# Patient Record
Sex: Female | Born: 1935
Health system: Southern US, Community
[De-identification: ages and names within clinical notes are randomized; demographics above are authoritative.]

## PROBLEM LIST (undated history)

## (undated) DIAGNOSIS — K5790 Diverticulosis of intestine, part unspecified, without perforation or abscess without bleeding: Secondary | ICD-10-CM

## (undated) DIAGNOSIS — I10 Essential (primary) hypertension: Secondary | ICD-10-CM

## (undated) DIAGNOSIS — T4145XA Adverse effect of unspecified anesthetic, initial encounter: Secondary | ICD-10-CM

## (undated) DIAGNOSIS — E7800 Pure hypercholesterolemia, unspecified: Secondary | ICD-10-CM

## (undated) DIAGNOSIS — M1990 Unspecified osteoarthritis, unspecified site: Secondary | ICD-10-CM

## (undated) DIAGNOSIS — K759 Inflammatory liver disease, unspecified: Secondary | ICD-10-CM

## (undated) DIAGNOSIS — I8390 Asymptomatic varicose veins of unspecified lower extremity: Secondary | ICD-10-CM

## (undated) DIAGNOSIS — T8859XA Other complications of anesthesia, initial encounter: Secondary | ICD-10-CM

## (undated) HISTORY — PX: TONSILLECTOMY: SUR1361

## (undated) HISTORY — PX: DILATION AND CURETTAGE OF UTERUS: SHX78

## (undated) HISTORY — DX: Diverticulosis of intestine, part unspecified, without perforation or abscess without bleeding: K57.90

## (undated) HISTORY — PX: OTHER SURGICAL HISTORY: SHX169

---

## 1977-12-27 DIAGNOSIS — K759 Inflammatory liver disease, unspecified: Secondary | ICD-10-CM

## 1977-12-27 HISTORY — DX: Inflammatory liver disease, unspecified: K75.9

## 1998-02-21 ENCOUNTER — Ambulatory Visit (HOSPITAL_COMMUNITY): Admission: RE | Admit: 1998-02-21 | Discharge: 1998-02-21 | Payer: Self-pay | Admitting: Obstetrics and Gynecology

## 1998-08-15 ENCOUNTER — Ambulatory Visit (HOSPITAL_COMMUNITY): Admission: RE | Admit: 1998-08-15 | Discharge: 1998-08-15 | Payer: Self-pay | Admitting: Internal Medicine

## 1999-02-23 ENCOUNTER — Encounter (HOSPITAL_BASED_OUTPATIENT_CLINIC_OR_DEPARTMENT_OTHER): Payer: Self-pay | Admitting: Internal Medicine

## 1999-02-23 ENCOUNTER — Ambulatory Visit (HOSPITAL_COMMUNITY): Admission: RE | Admit: 1999-02-23 | Discharge: 1999-02-23 | Payer: Self-pay | Admitting: Internal Medicine

## 1999-12-28 DIAGNOSIS — K5790 Diverticulosis of intestine, part unspecified, without perforation or abscess without bleeding: Secondary | ICD-10-CM

## 1999-12-28 HISTORY — DX: Diverticulosis of intestine, part unspecified, without perforation or abscess without bleeding: K57.90

## 2000-03-03 ENCOUNTER — Other Ambulatory Visit: Admission: RE | Admit: 2000-03-03 | Discharge: 2000-03-03 | Payer: Self-pay | Admitting: Obstetrics and Gynecology

## 2000-03-04 ENCOUNTER — Encounter: Payer: Self-pay | Admitting: Obstetrics and Gynecology

## 2000-03-04 ENCOUNTER — Ambulatory Visit (HOSPITAL_COMMUNITY): Admission: RE | Admit: 2000-03-04 | Discharge: 2000-03-04 | Payer: Self-pay | Admitting: Obstetrics and Gynecology

## 2000-12-27 HISTORY — PX: HYSTEROSCOPY: SHX211

## 2000-12-27 HISTORY — PX: OTHER SURGICAL HISTORY: SHX169

## 2001-01-23 ENCOUNTER — Ambulatory Visit (HOSPITAL_COMMUNITY): Admission: RE | Admit: 2001-01-23 | Discharge: 2001-01-23 | Payer: Self-pay | Admitting: Obstetrics and Gynecology

## 2001-01-23 ENCOUNTER — Encounter (INDEPENDENT_AMBULATORY_CARE_PROVIDER_SITE_OTHER): Payer: Self-pay

## 2001-03-06 ENCOUNTER — Encounter: Payer: Self-pay | Admitting: Obstetrics and Gynecology

## 2001-03-06 ENCOUNTER — Ambulatory Visit (HOSPITAL_COMMUNITY): Admission: RE | Admit: 2001-03-06 | Discharge: 2001-03-06 | Payer: Self-pay | Admitting: Obstetrics and Gynecology

## 2001-03-07 ENCOUNTER — Other Ambulatory Visit: Admission: RE | Admit: 2001-03-07 | Discharge: 2001-03-07 | Payer: Self-pay | Admitting: Obstetrics and Gynecology

## 2002-03-13 ENCOUNTER — Encounter: Payer: Self-pay | Admitting: Obstetrics and Gynecology

## 2002-03-13 ENCOUNTER — Ambulatory Visit (HOSPITAL_COMMUNITY): Admission: RE | Admit: 2002-03-13 | Discharge: 2002-03-13 | Payer: Self-pay | Admitting: Obstetrics and Gynecology

## 2002-03-20 ENCOUNTER — Other Ambulatory Visit: Admission: RE | Admit: 2002-03-20 | Discharge: 2002-03-20 | Payer: Self-pay | Admitting: Obstetrics and Gynecology

## 2002-08-26 ENCOUNTER — Encounter: Payer: Self-pay | Admitting: Orthopedic Surgery

## 2002-08-26 ENCOUNTER — Encounter: Admission: RE | Admit: 2002-08-26 | Discharge: 2002-08-26 | Payer: Self-pay | Admitting: Orthopedic Surgery

## 2003-03-18 ENCOUNTER — Ambulatory Visit (HOSPITAL_COMMUNITY): Admission: RE | Admit: 2003-03-18 | Discharge: 2003-03-18 | Payer: Self-pay | Admitting: Obstetrics and Gynecology

## 2003-03-18 ENCOUNTER — Encounter: Payer: Self-pay | Admitting: Obstetrics and Gynecology

## 2003-03-22 ENCOUNTER — Other Ambulatory Visit: Admission: RE | Admit: 2003-03-22 | Discharge: 2003-03-22 | Payer: Self-pay | Admitting: Obstetrics and Gynecology

## 2003-08-31 ENCOUNTER — Encounter: Admission: RE | Admit: 2003-08-31 | Discharge: 2003-08-31 | Payer: Self-pay | Admitting: Orthopedic Surgery

## 2003-08-31 ENCOUNTER — Encounter: Payer: Self-pay | Admitting: Orthopedic Surgery

## 2003-09-23 ENCOUNTER — Ambulatory Visit (HOSPITAL_BASED_OUTPATIENT_CLINIC_OR_DEPARTMENT_OTHER): Admission: RE | Admit: 2003-09-23 | Discharge: 2003-09-23 | Payer: Self-pay | Admitting: Orthopedic Surgery

## 2003-09-23 ENCOUNTER — Ambulatory Visit (HOSPITAL_COMMUNITY): Admission: RE | Admit: 2003-09-23 | Discharge: 2003-09-23 | Payer: Self-pay | Admitting: Orthopedic Surgery

## 2003-11-29 ENCOUNTER — Encounter: Admission: RE | Admit: 2003-11-29 | Discharge: 2003-11-29 | Payer: Self-pay | Admitting: Orthopedic Surgery

## 2003-12-28 HISTORY — PX: OTHER SURGICAL HISTORY: SHX169

## 2004-03-19 ENCOUNTER — Ambulatory Visit (HOSPITAL_COMMUNITY): Admission: RE | Admit: 2004-03-19 | Discharge: 2004-03-19 | Payer: Self-pay | Admitting: Obstetrics and Gynecology

## 2004-04-02 ENCOUNTER — Other Ambulatory Visit: Admission: RE | Admit: 2004-04-02 | Discharge: 2004-04-02 | Payer: Self-pay | Admitting: Obstetrics and Gynecology

## 2004-05-14 ENCOUNTER — Encounter (INDEPENDENT_AMBULATORY_CARE_PROVIDER_SITE_OTHER): Payer: Self-pay | Admitting: Specialist

## 2004-05-14 ENCOUNTER — Ambulatory Visit (HOSPITAL_COMMUNITY): Admission: RE | Admit: 2004-05-14 | Discharge: 2004-05-14 | Payer: Self-pay | Admitting: Gastroenterology

## 2005-03-22 ENCOUNTER — Ambulatory Visit (HOSPITAL_COMMUNITY): Admission: RE | Admit: 2005-03-22 | Discharge: 2005-03-22 | Payer: Self-pay | Admitting: Obstetrics and Gynecology

## 2005-04-14 ENCOUNTER — Other Ambulatory Visit: Admission: RE | Admit: 2005-04-14 | Discharge: 2005-04-14 | Payer: Self-pay | Admitting: Obstetrics and Gynecology

## 2005-05-02 ENCOUNTER — Emergency Department (HOSPITAL_COMMUNITY): Admission: EM | Admit: 2005-05-02 | Discharge: 2005-05-02 | Payer: Self-pay | Admitting: Emergency Medicine

## 2005-05-03 ENCOUNTER — Ambulatory Visit (HOSPITAL_COMMUNITY): Admission: RE | Admit: 2005-05-03 | Discharge: 2005-05-03 | Payer: Self-pay | Admitting: Internal Medicine

## 2006-03-24 ENCOUNTER — Ambulatory Visit (HOSPITAL_COMMUNITY): Admission: RE | Admit: 2006-03-24 | Discharge: 2006-03-24 | Payer: Self-pay | Admitting: Obstetrics and Gynecology

## 2006-05-03 ENCOUNTER — Other Ambulatory Visit: Admission: RE | Admit: 2006-05-03 | Discharge: 2006-05-03 | Payer: Self-pay | Admitting: Obstetrics and Gynecology

## 2006-12-27 HISTORY — PX: JOINT REPLACEMENT: SHX530

## 2006-12-27 HISTORY — PX: OTHER SURGICAL HISTORY: SHX169

## 2007-01-04 ENCOUNTER — Inpatient Hospital Stay (HOSPITAL_COMMUNITY): Admission: RE | Admit: 2007-01-04 | Discharge: 2007-01-08 | Payer: Self-pay | Admitting: Orthopedic Surgery

## 2007-03-27 ENCOUNTER — Ambulatory Visit (HOSPITAL_COMMUNITY): Admission: RE | Admit: 2007-03-27 | Discharge: 2007-03-27 | Payer: Self-pay | Admitting: Obstetrics and Gynecology

## 2007-06-02 ENCOUNTER — Other Ambulatory Visit: Admission: RE | Admit: 2007-06-02 | Discharge: 2007-06-02 | Payer: Self-pay | Admitting: Obstetrics and Gynecology

## 2008-04-01 ENCOUNTER — Ambulatory Visit (HOSPITAL_COMMUNITY): Admission: RE | Admit: 2008-04-01 | Discharge: 2008-04-01 | Payer: Self-pay | Admitting: Obstetrics and Gynecology

## 2008-06-03 ENCOUNTER — Other Ambulatory Visit: Admission: RE | Admit: 2008-06-03 | Discharge: 2008-06-03 | Payer: Self-pay | Admitting: Obstetrics and Gynecology

## 2008-09-25 ENCOUNTER — Ambulatory Visit (HOSPITAL_BASED_OUTPATIENT_CLINIC_OR_DEPARTMENT_OTHER): Admission: RE | Admit: 2008-09-25 | Discharge: 2008-09-25 | Payer: Self-pay | Admitting: Orthopedic Surgery

## 2009-04-03 ENCOUNTER — Ambulatory Visit (HOSPITAL_COMMUNITY): Admission: RE | Admit: 2009-04-03 | Discharge: 2009-04-03 | Payer: Self-pay | Admitting: Obstetrics and Gynecology

## 2010-04-06 ENCOUNTER — Ambulatory Visit (HOSPITAL_COMMUNITY): Admission: RE | Admit: 2010-04-06 | Discharge: 2010-04-06 | Payer: Self-pay | Admitting: Obstetrics and Gynecology

## 2010-06-03 ENCOUNTER — Emergency Department (HOSPITAL_COMMUNITY): Admission: EM | Admit: 2010-06-03 | Discharge: 2010-06-03 | Payer: Self-pay | Admitting: Family Medicine

## 2011-03-02 ENCOUNTER — Other Ambulatory Visit: Payer: Self-pay | Admitting: Obstetrics and Gynecology

## 2011-03-02 DIAGNOSIS — Z1231 Encounter for screening mammogram for malignant neoplasm of breast: Secondary | ICD-10-CM

## 2011-05-05 ENCOUNTER — Ambulatory Visit (HOSPITAL_COMMUNITY)
Admission: RE | Admit: 2011-05-05 | Discharge: 2011-05-05 | Disposition: A | Discharge status: Home / Self Care | Payer: Medicare Other | Source: Ambulatory Visit | Attending: Obstetrics and Gynecology | Admitting: Obstetrics and Gynecology

## 2011-05-05 DIAGNOSIS — Z1231 Encounter for screening mammogram for malignant neoplasm of breast: Secondary | ICD-10-CM | POA: Insufficient documentation

## 2011-05-11 NOTE — Op Note (Signed)
[name], [name]                 ACCOUNT NO.:  [id number]   MEDICAL RECORD NO.:  [id number]          PATIENT TYPE:  AMB   LOCATION:  NESC                         FACILITY:  [facility]   PHYSICIAN:  Ollen Gross, M.D.    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   PROCEDURE:  Right knee arthroscopy with synovectomy.   SURGEON:  Ollen Gross, M.D.   ASSISTANT:  No assistant.   ANESTHESIA:  General.   ESTIMATED BLOOD LOSS:  Minimal.   DRAINS:  None.   COMPLICATIONS:  None.   CONDITION:  Stable to recovery room.   BRIEF CLINICAL NOTE:  [name] is a 77 year old female who had a right  total knee arthroplasty done a couple years ago.  She has had pain with  stairs and has had a lot of popping and crepitance recently.  Exam and  history suggested synovitis with patellar clunk syndrome.  She presents  now for arthroscopy and synovectomy.   PROCEDURE IN DETAIL:  After the successful administration of general  anesthetic, tourniquet was placed high on the right thigh, right lower  extremity prepped and draped in the usual sterile fashion.  Standard  superomedial and inferolateral incisions made.  Inflow cannula passed  superomedial, camera passed inferolateral.  Arthroscopic visualization  proceeds.  At the junction of the quad tendon and patella there was a  large amount of hypertrophic synovial tissue.  A superolateral portal  was created and ArthroCare and shaver passed through there.  The  hypertrophic synovium was debrided back to the undersurface of the  tendon.  This effectively cleared out the suprapatellar pouch.  There  was also some synovitis in the lateral gutter and we debrided that out.  We then created an inferomedial  portal to get some of the inferior scar  out.  Once all the scar removal was completed I again inspected the  joint.  There were no areas of synovitis or loose bodies.  The  arthroscopic equipment was removed  from the inferior portals which were closed with interrupted 4-0 nylon.  Twenty mL of 0.25% Marcaine with epi injected through the inflow cannula  and then that is removed and that portal closed with nylon.  Bulky  sterile dressing was applied and she was awakened and transported to  recovery in stable condition.      Ollen Gross, M.D.  Electronically Signed     FA/MEDQ  D:  09/25/2008  T:  09/25/2008  Job:  [id number]

## 2011-05-14 NOTE — Op Note (Signed)
NAMELARENA, OHNEMUS                 ACCOUNT NO.:  [id number]   MEDICAL RECORD NO.:  [id number]          PATIENT TYPE:  INP   LOCATION:  X006                         FACILITY:  [facility]   PHYSICIAN:  Ollen Gross, M.D.    DATE OF BIRTH:  [birth year]   DATE OF PROCEDURE:  01/04/2007  DATE OF DISCHARGE:                               OPERATIVE REPORT   PREOPERATIVE DIAGNOSIS:  Osteoarthritis right knee.   POSTOPERATIVE DIAGNOSIS:  Osteoarthritis right knee.   PROCEDURE:  Right total knee arthroplasty.   SURGEON:  Dr. Lequita Halt.   ASSISTANT:  Avel Peace.   ANESTHESIA:  General with postop Marcaine pain pump.   ESTIMATED BLOOD LOSS:  Minimal.   DRAINS:  Hemovac x1.   TOURNIQUET TIME:  39 minutes at 300 mmHg.   COMPLICATIONS:  None.   CONDITION:  Stable to recovery.[name]   BRIEF CLINICAL NOTE:  Ms. Masaki is a 77 year old female with end-stage  arthritis in the  lateral and patellofemoral compartments of the right  knee.  She has failed nonoperative management and presents for total  knee arthroplasty.   DESCRIPTION OF PROCEDURE:  After successful administration of general  anesthetic, a tourniquet was placed high on the right thigh and right  lower extremity prepped and draped in the usual sterile fashion.  The  extremity is wrapped in Esmarch, knee flexed, tourniquet inflated to 300  mmHg. A midline incision is made with a 10 blade through the  subcutaneous tissue to the level of the extensor mechanism.  She did not  have a significant valgus deformity, so a fresh blade was then used to  make a medial parapatellar arthrotomy.  We minimally elevated the soft  tissue over the proximal medial tibia and then elevated the soft tissue  over the proximal lateral tibia with attention being paid to avoiding  the patellar tendon on the tibial tubercle. The patella is subluxed  laterally, knee flexed 90 degrees, ACL and PCL removed.  A drill was  used create a starting hole in the distal  femur and canal was thoroughly  irrigated.  A 5 degree right valgus and alignment guide is placed and  referencing off the posterior condyles, rotation is marked and a block  pinned to remove approximately 11 mm off the distal femur.  We took 11  because of a slight flexion contracture.  Distal femoral resection is  made with an oscillating saw.  A size 3 is the most appropriate femoral  component.  Rotation is marked off the epicondylar axis and a size 3  cutting block placed.  The anterior, posterior and chamfer cuts were  made.   The tibia subluxed forward and the menisci are removed.  Extramedullary  tibial alignment guide is placed referencing proximally at the medial  aspect of the tibial tubercle and distally along the second metatarsal  axis and tibial crest.  The block is pinned to remove approximately 6 mm  off the more deficient lateral side.  Tibial resection is made with an  oscillating saw.  A spacer block is placed 10 mm, 90 degrees of flexion  and in full extension.  We had excellent balance in both the flexion and  extension gap.  The size 2.5 is the most appropriate tibial component  and the proximal tibia is prepared with the modular drill and keel punch  for a size 2.5.  Femoral preparation is completed with the intercondylar  cut for the size 3.   A size 2.5 mobile bearing tibial trial, size 3 posterior stabilized  femoral trial and a 10 mm posterior stabilized rotating platform insert  trial are placed.  With the 10, full extension was achieved with  excellent varus and valgus balance throughout full range of motion.  The  osteophytes are then removed off the posterior femur.  The patella was  everted and thickness measured to be 20 mm.  Freehand resection is taken  to 12 mm, 35 template is placed, lug holes are drilled, trial patella  was placed and it tracks normally.  All trials were then removed and the  cut bone surfaces are prepared with pulsatile lavage.   The cement was  mixed and once ready for implantation the size 2.5 mobile bearing tibial  tray, size 3 posterior stabilized femur, and 35 patella are cemented  into place and the patella was held with a clamp.  A trial 10-mm insert  was placed, knee held in full extension and all extruded cement removed.  Once the cement was fully hardened then the permanent 10 mm posterior  stabilized rotating platform insert is placed into the tibial tray. The  wound was copiously irrigated with saline solution and the extensor  mechanism closed over a Hemovac drain with interrupted #1 PDS.  Flexion  against gravity is 135 degrees.  The tourniquet was released with a  total tourniquet time of 39 minutes.  Minor bleeding stopped with  cautery.  The subcu was closed with interrupted 2-0 Vicryl, subcuticular  with running 4-0 Monocryl.  The drain is hooked to suction, the incision  cleaned and dried and the catheter for the Marcaine pain pump is placed  in the pump is initiated.  A bulky sterile dressing is applied.  She is  placed into a knee immobilizer, awakened and transported to recovery in  stable condition.      Ollen Gross, M.D.  Electronically Signed     FA/MEDQ  D:  01/04/2007  T:  01/04/2007  Job:  [id number]

## 2011-05-14 NOTE — Op Note (Signed)
[facility]  Patient:    [name], [name]                        MRN: [id number] Proc. Date: 01/23/01 Adm. Date:  [id number] Attending:  Brynda Peon                           Operative Report  PREOPERATIVE DIAGNOSES:       1. Postmenopausal bleeding.                               2. Known endometrial polyps.  POSTOPERATIVE DIAGNOSES:      1. Postmenopausal bleeding.                               2. Known endometrial polyps.  OPERATION:                    Hysteroscopy, D&C, removal of polyp.  SURGEON:                      Cynthia P. Ashley Royalty, M.D.  ASSISTANT:  ANESTHESIA:                   Modified anesthesia care plus paracervical                               block.  ESTIMATED BLOOD LOSS:         Minimal.  COMPLICATIONS:                None.  FLUID DEFICIT:                110 cc but there were four soaked 4 x 4                               sponges that had been used to evacuate the                               Sorbitol from the vagina so the deficit was                               less than 110 cc in reality.  DESCRIPTION OF PROCEDURE:     The patient was taken to the operating room and given sedation through anesthesia.  Cervix was visualized and a paracervical block was instituted by injected 10 cc of 1% plain Xylocaine in each at 3 oclock and 9 oclock.  The uterus was then sounded to 7.5 cm.  The cervix was dilated to a #31 Shawnie Pons with some difficulty.  The resectoscope was introduced but would not pass easily through the endocervical canal.  It was very tight. Therefore, the resectoscope was removed and the diagnostic scope was inserted which passed easily.  Sorbitol was used as a distending medium.  The endometrial cavity was visualized.  The right and left tubal ostia were normal.  There was a polyp coming off the anterior left side of the fundus and this was photo documented.  The hysteroscope was removed. The polyp  forceps were introduced and the polyp was removed and sent to pathology.  Sharp curettage was carried out.  The specimen was sent to pathology.  The diagnostic scope was reinserted. The endometrial cavity appeared clean.  The hysteroscope was removed and the procedure was terminated.  The speculum was removed.  The tenaculum was removed and the patient was taken to the recovery room in good condition.  Sponge, needle and instrument counts were correct. DD:  01/23/01 TD:  01/23/01 Job: [id number]

## 2011-05-14 NOTE — H&P (Signed)
[name], WOMBLES NO.:  [id number]   MEDICAL RECORD NO.:  [id number]         PATIENT TYPE:  LINP   LOCATION:                               [facility]ACILITY[facility]  [facility]   PHYSICIAN:  Ollen Gross, M.D.    DATE OF BIRTH:  20-Jul-1932   DATE OF ADMISSION:  01/04/2007  DATE OF DISCHARGE:                              HISTORY & PHYSICAL   Date of office visit and history and physical was December 22, 2006.  Date of admission is January 04, 2007.   CHIEF COMPLAINT:  Bilateral knee pain.   HISTORY OF PRESENT ILLNESS:  The patient is a 77 year old female who has  been seen by Dr. Lequita Halt for ongoing bilateral knee pain.  Initially the  left knee was more problematic than the right; however, recently the  right knee has been causing her much more problems.  She is known to  have significant arthritis and pain in both knees.  Over the past  several months the right knee has progressively gotten worse and  interfering with her mobility and activity, starting to limit what she  can and cannot do.  She has been treated conservatively in the past for  her arthritis, including injections.  Despite conservative measures and  injections, she continues to have pain.  It is felt that she had reached  the point where she would benefit from undergoing surgical intervention.  Risks and benefits have been discussed and she has elected to proceed  with surgery.  She has been seen preoperatively by Dr. Creola Corn with  workup and is approved and cleared for her knee replacement at this  time.   ALLERGIES:  No known drug allergies.   CURRENT MEDICATIONS:  Crestor, amlodipine/benazepril, indapamide, baby  aspirin, biotin, Caltrate Plus D, Centrum Silver,  glucosamine/chondroitin, and Tylenol Arthritis.   PAST MEDICAL HISTORY:  1. Hypertension.  2. Hyperlipidemia.  3. Osteopenia.  4. Hemorrhoids.  5. History of hepatitis A in 1979.  6. Insomnia.  7. Postmenopausal.   PAST SURGICAL  HISTORY:  1. Tonsillectomy and adenoidectomy.  2. D&C x2.  3. Right knee arthroscopy.  4. Right shoulder rotator cuff repair.  5. Basal cell carcinoma removal.   SOCIAL HISTORY:  Married.  Three children, 5 grandchildren.  She is a  housewife.  Nonsmoker.  Seldom intake of alcohol.   FAMILY HISTORY:  Mother and father both had strokes.  Mother with a  history of osteopenia.  Mother deceased at age 72.  Father:  History of  prostate cancer, deceased at age 32.  Sister with bilateral knee  replacements and a hip replacement.  One of her children has multiple  sclerosis.   REVIEW OF SYSTEMS:  GENERAL:  No fevers, chills or night sweats.  NEUROLOGIC:  No seizure, syncope or paralysis.  RESPIRATORY:  No  shortness of breath, productive cough or hemoptysis.  CARDIOVASCULAR:  No chest pain, angina or orthopnea.  GASTROINTESTINAL:  No nausea,  vomiting, diarrhea or constipation.  GENITOURINARY:  No dysuria,  hematuria or discharge.  MUSCULOSKELETAL:  Right knee.   PHYSICAL EXAMINATION:  VITAL  SIGNS:  Pulse 68, respirations 12, blood  pressure 128/76.  GENERAL:  The patient is a 77 year old white female, well-nourished,  well-developed, in no acute distress.  She is a good historian.  She is  alert, oriented and cooperative, very pleasant at time of exam.  HEENT:  Normocephalic, atraumatic.  Pupils round, reactive.  EOMs  intact.  Oropharynx is clear.  NECK:  Supple, no carotid bruits are appreciated.  CHEST:  Clear anterior/posterior chest walls.  No rhonchi, rales or  wheezing.  HEART:  Regular rhythm, S1, S2 noted, no murmurs.  ABDOMEN:  Soft, nontender.  Bowel sounds present.  RECTAL, BREASTS, GENITALIA:  Not done, not pertinent to present illness.  EXTREMITIES:  Right knee shows a slight valgus malalignment deformity,  range of motion of 0-125, 10 more medial than lateral.  Left knee shows  a range of motion of 0-125, no swelling, no instability.   IMPRESSION:  1. Bilateral knee  osteoarthritis, right more symptomatic than left.  2. Hypertension.  3. Hyperlipidemia.  4. Osteopenia.  5. Hemorrhoids.  6. History of hepatitis A, 1979.  7. Insomnia.  8. Postmenopausal.   PLAN:  The patient admitted to [facility] to undergo a right  total knee replacement arthroplasty.  Surgery will be performed by Ollen Gross, MD.  Her medical physician is Gwen Pounds, MD, who will be  notified of her room number and admission and will be consulted if  needed for medical assistance with the patient throughout the hospital  course.      Alexzandrew L. Julien Girt, P.A.      Ollen Gross, M.D.  Electronically Signed    ALP/MEDQ  D:  01/03/2007  T:  01/04/2007  Job:  [id number]   cc:   Gwen Pounds, MD  Fax: [phone]

## 2011-05-14 NOTE — Discharge Summary (Signed)
[name], [name]                 ACCOUNT NO.:  [id number]   MEDICAL RECORD NO.:  [id number]          PATIENT TYPE:  INP   LOCATION:  1503                         FACILITY:  [facility]   PHYSICIAN:  Ollen Gross, M.D.    DATE OF BIRTH:  November 24, 1932   DATE OF ADMISSION:  01/04/2007  DATE OF DISCHARGE:  01/08/2007                               DISCHARGE SUMMARY   ADMITTING DIAGNOSES:  1. Bilateral knees osteoarthritis, right more symptomatic than left.  2. Hypertension.  3. Hyperlipidemia.  4. Osteopenia.  5. Hemorrhoids.  6. History of hepatitis A in 1979.  7. Insomnia.  8. Postmenopausal.   DISCHARGE DIAGNOSES:  1. Osteoarthritis, right knee, status post right total knee      arthroplasty.  2. Osteoarthritis of left knee.  3. Mild acute blood loss anemia.  4. Postoperative hypokalemia, improved.  5. Hypertension.  6. Hyperlipidemia.  7. Osteopenia.  8. Hemorrhoids.  9. History of hepatitis A in 1979.  10.Insomnia.  11.Postmenopausal.   PROCEDURE:  January 03, 2007, right total knee, surgeon Dr. Lequita Halt,  assistant Avel Peace, PA-C.  Anesthesia general.  Tourniquet time 39  minutes.   CONSULTS:  None.   BRIEF HISTORY:  [name] is a 77 year old female with end-stage  arthritis in the lateral patellofemoral compartment, has failed  nonoperative management, now presents for total knee arthroplasty.   LABORATORY DATA:  Preop CBC:  Hemoglobin 11.6, hematocrit 33.2, white  cell count 7.2, postop hemoglobin dropped down to 10.3, then to 9.4,  back up to 9.8.  Preop PTT/PT 13.1 and 32, respectively.  INR 1.0.  Serial pro times followed.  Last noted PT/INR 18.7 and 1.5.  Chem panel  on admission all within normal limits.  Serial BMETs were followed.  Sodium did drop down from 4.3 to 3.3, back up to 3.8.   EKG:  EKG is dated December 30, 20078, normal sinus rhythm, no change  from previous tracing confirmed.  Chest x-ray January 06, 2007, tiny  bilateral pleural effusions,  mildly prominent right hilum, clinical  suspicion, consider CT.   HOSPITAL COURSE:  The patient admitted to [facility], taken to  the OR, underwent above-stated procedure without complication.  The  patient tolerated the procedure well, later transferred to recovery room  and orthopedic floor, started on PCA and p.o. analgesics for pain  control following surgery, given 24 hours postop IV antibiotic, ended up  staying in the PACU through the night actually and then transferred up  to the orthopedic floor on the morning of day 1.  Did very well that  evening.  Hemovac drain was pulled on day 1.  Had excellent urinary  output.  Once got up to the floor, started getting up physical therapy.  By day 2, she was getting up a little bit.  She did have some nausea in  the afternoon of day 1.  Nausea was improved.  Dressing was changed;  incision looked good.  Fluids and IVs were discontinued at that point.  Hemoglobin was down to 10.3.  It was felt that if she was doing well,  she may be able to go home over the weekend.  On day 3, she was doing a  little bit better.  Continued to improve, started getting up with more  physical therapy.  She was up ambulating about 40 feet but not quite  ready for discharge.  Weaned over to p.o. medications, progressed with  therapy and by the following day of January 08, 2007, she was up moving  well, tolerating medications, was discharged home.   DISCHARGE PLAN:  1. The patient discharged home on January 08, 2007.  2. Discharge diagnoses, please see above.  3. Discharge medications:  Percocet, Robaxin, Coumadin.  4. Diet:  Resume home diet.  5. Activity:  Weightbearing as tolerated, total knee protocol, home      health physical therapy, home health nursing, followup in 2 weeks.   DISPOSITION:  Home.   CONDITION ON DISCHARGE:  Improved.      Alexzandrew L. Julien Girt, P.A.      Ollen Gross, M.D.  Electronically Signed    ALP/MEDQ  D:   02/03/2007  T:  02/03/2007  Job:  [id number]   cc:   Gwen Pounds, MD  Fax: [phone]

## 2011-05-14 NOTE — Op Note (Signed)
[name], [name]                           ACCOUNT NO.:  [id number]   MEDICAL RECORD NO.:  [id number]                   PATIENT TYPE:  AMB   LOCATION:  ENDO                                 FACILITY:  [facility]   PHYSICIAN:  Bernette Redbird, M.D.                DATE OF BIRTH:  1932-07-06   DATE OF PROCEDURE:  05/14/2004  DATE OF DISCHARGE:                                 OPERATIVE REPORT   PROCEDURE PERFORMED:  Colonoscopy with biopsy.   ENDOSCOPIST:  Florencia Reasons, M.D.   INDICATIONS FOR PROCEDURE:  The patient is a 77 year old female for colon  cancer screening.  No worrisome risk factors or symptoms.   FINDINGS:  Diminutive polyp removed.  Moderate diverticulosis.   DESCRIPTION OF PROCEDURE:  The nature, purpose and risks of the procedure  had been discussed with the patient, who provided written consent.  Sedation  was fentanyl 100 mcg and Versed 10 mg IV without arrhythmias or  desaturation.   The procedure was initiated with the Olympus adult video colonoscope which  was advanced to about 30 cm but there I encountered a fairly sharp kink in  the colon, past which I was unable to advance the scope, so the scope was  withdrawn, examining the rectosigmoid.  Retroflexion in the rectum was  unremarkable as was reinspection of the rectum.  I then recolonoscoped the  patient using the adjustable tension pediatric video colonoscope which was  able to be advanced quite easily to the cecum as identified by clear  visualization of the appendiceal orifice and pullback was then performed.  The quality of the prep was excellent and it is felt that all areas were  well seen.   There was moderate sigmoid diverticulosis with fixation and angulation, also  some mild proximal colonic diverticular change as well.  At 28 cm from the  external anal opening was a hyperplastic appearing 3 mm polyp removed by a  couple of cold biopsies.  No other polyps were seen and there was no  evidence of  cancer, colitis or vascular malformations.   The patient tolerated the procedure well and there were no apparent  complications.   IMPRESSION:  1. Solitary diminutive colonic polyp removed as described above (211.3).  2. Moderate diverticulosis.   PLAN:  Await pathology results.  I would anticipate a screening flexible  sigmoidoscopy in five years if the polyp is hyperplastic and a full  colonoscopy in five years if the polyp is adenomatous.                                               Bernette Redbird, M.D.    RB/MEDQ  D:  05/14/2004  T:  05/15/2004  Job:  [id number]   cc:   Aram Beecham P. Romine,  M.D.  [address], [address]  [place]  [place] [address]  Fax: [phone]   Gwen Pounds, M.D.  [address]  [place]  [place] [address]  Fax: [phone]

## 2011-05-14 NOTE — Op Note (Signed)
[name], [name]                           ACCOUNT NO.:  [id number]   MEDICAL RECORD NO.:  [id number]                   PATIENT TYPE:  AMB   LOCATION:  DSC                                  FACILITY:  [facility]   PHYSICIAN:  Robert A. Thurston Hole, M.D.              DATE OF BIRTH:  May 13, 1932   DATE OF PROCEDURE:  09/23/2003  DATE OF DISCHARGE:                                 OPERATIVE REPORT   PREOPERATIVE DIAGNOSIS:  1. Right shoulder rotator cuff tear.  2. Right shoulder biceps tendon dislocation.  3. Right shoulder partial labrum tear.  4. Right shoulder impingement.   POSTOPERATIVE DIAGNOSIS:  1. Right shoulder rotator cuff tear.  2. Right shoulder biceps tendon dislocation.  3. Right shoulder partial labrum tear.  4. Right shoulder impingement.   PROCEDURE:  1. Right shoulder EUA followed by arthroscopic partial labrum tear     debridement.  2. Right shoulder biceps tendon release and tenodesis.  3. Right shoulder mini open rotator cuff repair using Arthr ek suture     anchors x2.  4. Right shoulder subacromial decompression.   SURGEON:  Elana Alm. Thurston Hole, M.D.   ASSISTANT:  Julien Girt, P.A.   ANESTHESIA:  General.   OPERATIVE TIME:  1 hour 10 minutes.   COMPLICATIONS:  None.   INDICATIONS FOR PROCEDURE:  The patient is a 77 year old woman who has had  significant right shoulder pain for the past six to eight weeks secondary to  a fall and injury to her shoulder with examination and MRI documenting  rotator cuff tear and biceps tendon dislocation. She is now to undergo  arthroscopy and repair.   DESCRIPTION OF PROCEDURE:  The patient was brought to the operating room on  September 23, 2003, after an interscalene block had been placed in the  holding room for postoperative pain control. She was placed on the operating  table in the supine position.  After being placed under general anesthesia,  her right shoulder was examined under anesthesia. She had full  range of  motion and her shoulder was stable to ligamentous examination.  After this  was done, she was placed in a beach chair position and her shoulder and arm  was prepped using sterile Duraprep and draped using sterile technique. She  received Ancef 1 gram IV for prophylaxis.  Initially the arthroscopy was  performed through a posterior arthroscopic portal, and the arthroscope with  a pump attached was placed and through an anterior portal, an arthroscopic  probe was placed.  On initial inspection, the articular cartilage and the  glenohumeral joint was intact.  The anterior labrum showed partial tearing  25% which was debrided.  The inferior labrum and anterior inferior  glenohumeral ligament complex was intact.  Superior labrum was intact.  The  biceps tendon was completely dislocated anterior inferiorly.  The posterior  labrum was intact.  Inferior capsule recess was free  of pathology. The  rotator cuff showed a large tear with retraction of the supraspinatus and  infraspinatus and this was partially debrided arthroscopically.  Significant  impingement noted as well and a subacromial decompression was carried out  removing 6 mm of the undersurface of the anterior, anterolateral, and  anterior medial acromion.  After this was done then through the lateral  portal, this was extended to a 3 to 4 cm deltoid splitting incision. The  underlying subcutaneous tissues were incised in line with the skin incision.  Subacromial space was entered by bluntly dissecting through the deltoid  muscle and fascia.  The biceps tendon was found and released from its  insertion on the glenoid rim and then shortened and a #2 Fiberwire was  weaved through it and then each of these ends of the sutures were placed  through each medial and lateral bony edge of the bicipital groove and then  tied down, thus resecuring the biceps tendon and tenodesing it into the  humeral groove.  After this was done, then  attention was turned to the  rotator cuff repair.  Two separate 6.5 mm Arthr ek suture anchors were  placed in the greater tuberosity and each of the sutures in these anchors  were passed through the tear in the rotator cuff and tied down thus  resecuring the rotator cuff tear back down to the greater tuberosity. The  entire supraspinatus and about 85 to 90% of the infraspinatus could be  repaired.  After this was done, the shoulder could be brought through a full  range of motion, but there was found to be no impingement on the repair.  At  this point, the wound was irrigated and then closed using 0 Vicryl to close  the deltoid fascia, subcutaneous tissues were closed with 2-0 Vicryl,  subcuticular layer closed with 3-0 Prolene. The arthroscopic portals were  closed with 3-0 nylon. Sterile dressings and a sling was applied. The  patient awakened, extubated, and taken to the recovery room in stable  condition.  Needle, sponge, and instrument count correct x2 at the end of  the case.   FOLLOW UP:  The [facility] for IV pain control and neurovascularly monitoring.  Discharged  tomorrow on Percocet and Naprosyn with early physical therapy.  Seen back in  the office in a week for sutures out and follow-up.                                               Robert A. Thurston Hole, M.D.    RAW/MEDQ  D:  09/23/2003  T:  09/23/2003  Job:  [id number]

## 2011-09-27 LAB — POCT I-STAT 4, (NA,K, GLUC, HGB,HCT)
Glucose, Bld: 96
Potassium: 3.5

## 2011-12-28 HISTORY — PX: JOINT REPLACEMENT: SHX530

## 2012-01-05 ENCOUNTER — Encounter (HOSPITAL_COMMUNITY): Payer: Self-pay

## 2012-01-09 ENCOUNTER — Other Ambulatory Visit: Payer: Self-pay | Admitting: Orthopedic Surgery

## 2012-01-09 NOTE — H&P (Signed)
[name]  DOB: 13-May-1932 Married / Language: English / Race: White / Female  Date of Admission:  01/19/2012  Chief Complaint:  Left Knee Pain  History of Present Illness The patient is a 78 year old female who comes in today for a preoperative History and Physical. The patient is scheduled for a left total knee arthroplasty to be performed by Dr. Gus Rankin. Aluisio, MD at [facility] on 01/19/2012. They have been treated conservatively in the past for the above stated problem and despite conservative measures, they continue to have progressive pain and severe functional limitations and dysfunction. They have failed non-operative management including home exercise, medications, and injections. It is felt that they would benefit from undergoing total joint replacement. Risks and benefits of the procedure have been discussed with the patient and they elect to proceed with surgery. There are no active contraindications to surgery such as ongoing infection or rapidly progressive neurological disease.  Allergies No Known Drug Allergies  Medication History Benazepril HCl (20MG  Tablet, 2 Oral daily) Active. Indapamide (2.5MG  Tablet, Oral daily) Active. Crestor (10MG  Tablet, Oral three times a week) Active. Aspirin (81MG  Tablet, Oral daily) Active. Tylenol Extra Strength (500MG  Tablet, Oral four times daily, as needed) Active.  Problem List/Past Medical Hypertension Hypercholesterolemia Varicose veins Hepatitis A. 1979  Past Surgical History Rotator Cuff Repair - Right Total Knee Replacement - Right. Date: 2008. D & C Procedure. times 2  Family History Father. Deceased, Prostate Cancer. age 31 Mother. Deceased, Cerebrovascular Accident. age 30 Brother 1. Deceased. age 3, Pancreatic Cancer  Social History Tobacco use. Never smoker. Alcohol use. Occasional alcohol use. glass of wine Marital status. Married. Children. 3 Living situation. Lives with  spouse. Post-Surgical Plans. Plans for [facility] Advance Directives. Living Will and Healthcare POA  Review of Systems General:Not Present- Chills, Fever, Night Sweats, Fatigue, Weight Gain, Weight Loss and Memory Loss. Skin:Not Present- Hives, Itching, Rash, Eczema and Lesions. HEENT:Not Present- Tinnitus, Headache, Double Vision, Visual Loss, Hearing Loss and Dentures. Respiratory:Not Present- Shortness of breath with exertion, Shortness of breath at rest, Allergies, Coughing up blood and Chronic Cough. Cardiovascular:Not Present- Chest Pain, Racing/skipping heartbeats, Difficulty Breathing Lying Down, Murmur, Swelling and Palpitations. Gastrointestinal:Not Present- Bloody Stool, Heartburn, Abdominal Pain, Vomiting, Nausea, Constipation, Diarrhea, Difficulty Swallowing, Jaundice and Loss of appetitie. Female Genitourinary:Present- Urinating at Night. Not Present- Blood in Urine, Urinary frequency, Weak urinary stream, Discharge, Flank Pain, Incontinence, Painful Urination, Urgency and Urinary Retention. Musculoskeletal:Present- Morning Stiffness. Not Present- Muscle Weakness, Muscle Pain, Joint Swelling, Joint Pain, Back Pain and Spasms. Neurological:Not Present- Tremor, Dizziness, Blackout spells, Paralysis, Difficulty with balance and Weakness. Psychiatric:Present- Insomnia.  Vitals Weight: 181 lb Height: 66 in Body Surface Area: 1.96 m Body Mass Index: 29.21 kg/m Pulse: 72 (Regular) Resp.: 14 (Unlabored) BP: 134/80 (Sitting, Right Arm, Standard)  Physical Exam The physical exam findings are as follows: Patient is a 78 year old female with continued knee pain.  General Mental Status - Alert, cooperative and good historian. General Appearance- pleasant. Not in acute distress. Orientation- Oriented X3. Build & Nutrition- Well nourished and Well developed.  Head and Neck Head- normocephalic, atraumatic . Neck Global Assessment- supple. no bruit  auscultated on the right and no bruit auscultated on the left.  Eye Pupil- Bilateral- Regular and Round. Motion- Bilateral- EOMI.  Chest and Lung Exam Auscultation: Breath sounds:- clear at anterior chest wall and - clear at posterior chest wall. Adventitious sounds:- No Adventitious sounds.  Cardiovascular Auscultation:Rhythm- Regular rate and rhythm. Heart  Sounds- S1 WNL and S2 WNL. Murmurs & Other Heart Sounds:Auscultation of the heart reveals - No Murmurs.  Abdomen Palpation/Percussion:Tenderness- Abdomen is non-tender to palpation. Rigidity (guarding)- Abdomen is soft. Auscultation:Auscultation of the abdomen reveals - Bowel sounds normal.  Female Genitourinary  Not done, not pertinent to present illness  Musculoskeletal Right knee looks great. Range 0 to 125 degrees. There is no swelling, tenderness or instability. No crepitus on range of motion. The left knee shows valgus deformity. Range is about 5 to 120. There is marked crepitus on range of motion. She is tender lateral greater than medial. There is no instability.  RADIOGRAPHS: X-rays taken today, AP and lateral of both, show that the prosthesis on the right is in excellent position with no periprosthetic abnormalities. On the left she has endstage arthritis, bone on bone lateral and patellofemoral compartments. She has about a 10 degree valgus deformity.  Assessment & Plan Osteoarthritis Left Knee  Patient is for a Left Total Knee Replacement by Dr. Lequita Halt.  Plan is for [facility] following surgery.  Avel Peace, PA-C   PCP - Dr. Glendell Docker

## 2012-01-12 ENCOUNTER — Encounter (HOSPITAL_COMMUNITY)
Admission: RE | Admit: 2012-01-12 | Discharge: 2012-01-12 | Disposition: A | Discharge status: Home / Self Care | Payer: Medicare Other | Source: Ambulatory Visit | Attending: Orthopedic Surgery | Admitting: Orthopedic Surgery

## 2012-01-12 ENCOUNTER — Encounter (HOSPITAL_COMMUNITY): Payer: Self-pay

## 2012-01-12 HISTORY — DX: Inflammatory liver disease, unspecified: K75.9

## 2012-01-12 HISTORY — DX: Pure hypercholesterolemia, unspecified: E78.00

## 2012-01-12 HISTORY — DX: Other complications of anesthesia, initial encounter: T88.59XA

## 2012-01-12 HISTORY — DX: Essential (primary) hypertension: I10

## 2012-01-12 HISTORY — DX: Adverse effect of unspecified anesthetic, initial encounter: T41.45XA

## 2012-01-12 HISTORY — DX: Asymptomatic varicose veins of unspecified lower extremity: I83.90

## 2012-01-12 LAB — COMPREHENSIVE METABOLIC PANEL
BUN: 27 mg/dL — ABNORMAL HIGH (ref 6–23)
CO2: 28 mEq/L (ref 19–32)
Calcium: 10.4 mg/dL (ref 8.4–10.5)
Chloride: 102 mEq/L (ref 96–112)
Creatinine, Ser: 1.12 mg/dL — ABNORMAL HIGH (ref 0.50–1.10)
GFR calc Af Amer: 53 mL/min — ABNORMAL LOW (ref >90)
GFR calc non Af Amer: 46 mL/min — ABNORMAL LOW (ref >90)
Total Bilirubin: 0.5 mg/dL (ref 0.3–1.2)

## 2012-01-12 LAB — APTT: aPTT: 32 seconds (ref 24–37)

## 2012-01-12 LAB — PROTIME-INR: Prothrombin Time: 13.3 seconds (ref 11.6–15.2)

## 2012-01-12 NOTE — Pre-Procedure Instructions (Addendum)
MEDICAL CLEARANCE NOTE DR RUSSO ON CHART 2 VIEW CHEST XRAY 01-04-2012 AND EKG 01-04-2012 DR RUSSO  ON CHART CBC AND UA 01-04-2012 DR RUSSO ON CHART

## 2012-01-12 NOTE — Patient Instructions (Signed)
20 JEREMIAH TARPLEY  01/12/2012   Your procedure is scheduled on:  01-19-2012  Report to [facility] at 0730 AM.  Call this number if you have problems the morning of surgery: [phone]   Remember:   Do not eat food OR DRINK LIQUIDS:After Midnight.  .  .  Take these medicines the morning of surgery with A SIP OF WATER:CRESTOR   Do not wear jewelry, make-up  Do not wear lotions, powders, or perfumes DO NOT  wear deodorant.  Do not shave 48 hours prior to surgery.  Do not bring valuables to the hospital.  Contacts, dentures or bridgework may not be worn into surgery.  Leave suitcase in the car. After surgery it may be brought to your room.  For patients admitted to the hospital, checkout time is 11:00 AM the day of discharge.   SPECIAL Instructions: CHG Shower Use Special Wash: 1/2 bottle night before surgery and 1/2 bottle morning of surgery.NECK DOWN AVOID PRIVATE AREA   Please read over the following fact sheets that you were given: MRSA Information, BLOOD FACT SHEET Anneta Rounds, RN [facility] PRE OP NURSE PHONE NUMBER [phone]

## 2012-01-18 MED ORDER — BUPIVACAINE 0.25 % ON-Q PUMP SINGLE CATH 300ML
300.0000 mL | INJECTION | Status: DC
  Filled 2012-01-18: qty 300

## 2012-01-19 ENCOUNTER — Encounter (HOSPITAL_COMMUNITY): Payer: Self-pay | Admitting: Anesthesiology

## 2012-01-19 ENCOUNTER — Encounter (HOSPITAL_COMMUNITY): Payer: Self-pay | Admitting: *Deleted

## 2012-01-19 ENCOUNTER — Inpatient Hospital Stay (HOSPITAL_COMMUNITY)
Admission: RE | Admit: 2012-01-19 | Discharge: 2012-01-22 | DRG: 470 | Disposition: A | Discharge status: Skilled Nursing Facility | Payer: Medicare Other | Source: Ambulatory Visit | Attending: Orthopedic Surgery | Admitting: Orthopedic Surgery

## 2012-01-19 ENCOUNTER — Inpatient Hospital Stay (HOSPITAL_COMMUNITY): Payer: Medicare Other | Admitting: Anesthesiology

## 2012-01-19 ENCOUNTER — Encounter (HOSPITAL_COMMUNITY)
Admission: RE | Disposition: A | Discharge status: Skilled Nursing Facility | Payer: Self-pay | Source: Ambulatory Visit | Attending: Orthopedic Surgery

## 2012-01-19 DIAGNOSIS — M1710 Unilateral primary osteoarthritis, unspecified knee: Principal | ICD-10-CM | POA: Diagnosis present

## 2012-01-19 DIAGNOSIS — Z96659 Presence of unspecified artificial knee joint: Secondary | ICD-10-CM

## 2012-01-19 DIAGNOSIS — Z01812 Encounter for preprocedural laboratory examination: Secondary | ICD-10-CM

## 2012-01-19 DIAGNOSIS — I10 Essential (primary) hypertension: Secondary | ICD-10-CM | POA: Diagnosis present

## 2012-01-19 HISTORY — PX: TOTAL KNEE ARTHROPLASTY: SHX125

## 2012-01-19 LAB — TYPE AND SCREEN
ABO/RH(D): O POS
Antibody Screen: NEGATIVE

## 2012-01-19 SURGERY — ARTHROPLASTY, KNEE, TOTAL
Anesthesia: Spinal | Site: Knee | Laterality: Left | Wound class: Clean

## 2012-01-19 MED ORDER — ONDANSETRON HCL 4 MG/2ML IJ SOLN: 4.0000 mg | Freq: Four times a day (QID) | INTRAMUSCULAR | Status: DC | PRN

## 2012-01-19 MED ORDER — BUPIVACAINE ON-Q PAIN PUMP (FOR ORDER SET NO CHG)
INJECTION | Status: DC
  Filled 2012-01-19: qty 1

## 2012-01-19 MED ORDER — ACETAMINOPHEN 325 MG PO TABS: 650.0000 mg | ORAL_TABLET | Freq: Four times a day (QID) | ORAL | Status: DC | PRN

## 2012-01-19 MED ORDER — METHOCARBAMOL 500 MG PO TABS
500.0000 mg | ORAL_TABLET | Freq: Four times a day (QID) | ORAL | Status: DC | PRN
  Administered 2012-01-20 – 2012-01-22 (×6): 500 mg via ORAL
  Filled 2012-01-19 (×7): qty 1

## 2012-01-19 MED ORDER — LACTATED RINGERS IV SOLN
INTRAVENOUS | Status: DC
  Administered 2012-01-19: 12:00:00 via INTRAVENOUS

## 2012-01-19 MED ORDER — MENTHOL 3 MG MT LOZG
1.0000 | LOZENGE | OROMUCOSAL | Status: DC | PRN
  Filled 2012-01-19: qty 9

## 2012-01-19 MED ORDER — MORPHINE SULFATE 2 MG/ML IJ SOLN: 1.0000 mg | INTRAMUSCULAR | Status: DC | PRN

## 2012-01-19 MED ORDER — MIDAZOLAM HCL 5 MG/5ML IJ SOLN
INTRAMUSCULAR | Status: DC | PRN
  Administered 2012-01-19: 1 mg via INTRAVENOUS
  Administered 2012-01-19: 0.5 mg via INTRAVENOUS

## 2012-01-19 MED ORDER — BUPIVACAINE IN DEXTROSE 0.75-8.25 % IT SOLN
INTRATHECAL | Status: DC | PRN
  Administered 2012-01-19: 1.6 mL via INTRATHECAL

## 2012-01-19 MED ORDER — LIDOCAINE HCL (CARDIAC) 20 MG/ML IV SOLN
INTRAVENOUS | Status: DC | PRN
  Administered 2012-01-19: 50 mg via INTRAVENOUS

## 2012-01-19 MED ORDER — DOCUSATE SODIUM 100 MG PO CAPS
100.0000 mg | ORAL_CAPSULE | Freq: Two times a day (BID) | ORAL | Status: DC
  Administered 2012-01-19 – 2012-01-22 (×6): 100 mg via ORAL
  Filled 2012-01-19 (×7): qty 1

## 2012-01-19 MED ORDER — MORPHINE SULFATE (PF) 0.5 MG/ML IJ SOLN
INTRAMUSCULAR | Status: DC | PRN
  Administered 2012-01-19: 200 ug via EPIDURAL

## 2012-01-19 MED ORDER — PROMETHAZINE HCL 25 MG/ML IJ SOLN: 6.2500 mg | INTRAMUSCULAR | Status: DC | PRN

## 2012-01-19 MED ORDER — PHENOL 1.4 % MT LIQD
1.0000 | OROMUCOSAL | Status: DC | PRN
  Filled 2012-01-19: qty 177

## 2012-01-19 MED ORDER — BUPIVACAINE 0.25 % ON-Q PUMP SINGLE CATH 300ML
INJECTION | Status: DC | PRN
  Administered 2012-01-19: 300 mL

## 2012-01-19 MED ORDER — SODIUM CHLORIDE 0.9 % IR SOLN
Status: DC | PRN
  Administered 2012-01-19: 1000 mL

## 2012-01-19 MED ORDER — OXYCODONE HCL 5 MG PO TABS
5.0000 mg | ORAL_TABLET | ORAL | Status: DC | PRN
  Administered 2012-01-19: 5 mg via ORAL
  Administered 2012-01-20 (×2): 10 mg via ORAL
  Filled 2012-01-19 (×2): qty 2
  Filled 2012-01-19: qty 1

## 2012-01-19 MED ORDER — ONDANSETRON HCL 4 MG/2ML IJ SOLN
INTRAMUSCULAR | Status: DC | PRN
  Administered 2012-01-19: 4 mg via INTRAVENOUS

## 2012-01-19 MED ORDER — LACTATED RINGERS IV SOLN
INTRAVENOUS | Status: DC | PRN
  Administered 2012-01-19: 10:00:00 via INTRAVENOUS

## 2012-01-19 MED ORDER — FENTANYL CITRATE 0.05 MG/ML IJ SOLN
INTRAMUSCULAR | Status: DC | PRN
  Administered 2012-01-19: 50 ug via INTRAVENOUS

## 2012-01-19 MED ORDER — EPHEDRINE SULFATE 50 MG/ML IJ SOLN
INTRAMUSCULAR | Status: DC | PRN
  Administered 2012-01-19: 5 mg via INTRAVENOUS
  Administered 2012-01-19: 10 mg via INTRAVENOUS
  Administered 2012-01-19 (×3): 5 mg via INTRAVENOUS

## 2012-01-19 MED ORDER — ROSUVASTATIN CALCIUM 10 MG PO TABS
10.0000 mg | ORAL_TABLET | ORAL | Status: DC
  Administered 2012-01-21: 10 mg via ORAL
  Filled 2012-01-19 (×2): qty 1

## 2012-01-19 MED ORDER — ACETAMINOPHEN 650 MG RE SUPP: 650.0000 mg | Freq: Four times a day (QID) | RECTAL | Status: DC | PRN

## 2012-01-19 MED ORDER — ONDANSETRON HCL 4 MG PO TABS
4.0000 mg | ORAL_TABLET | Freq: Four times a day (QID) | ORAL | Status: DC | PRN
  Administered 2012-01-22: 4 mg via ORAL
  Filled 2012-01-19: qty 1

## 2012-01-19 MED ORDER — CEFAZOLIN SODIUM 1-5 GM-% IV SOLN
1.0000 g | Freq: Four times a day (QID) | INTRAVENOUS | Status: AC
  Administered 2012-01-19 – 2012-01-20 (×3): 1 g via INTRAVENOUS
  Filled 2012-01-19 (×5): qty 50

## 2012-01-19 MED ORDER — HYDROMORPHONE HCL PF 1 MG/ML IJ SOLN: 0.2500 mg | INTRAMUSCULAR | Status: DC | PRN

## 2012-01-19 MED ORDER — INDAPAMIDE 2.5 MG PO TABS
2.5000 mg | ORAL_TABLET | Freq: Every day | ORAL | Status: DC
  Administered 2012-01-20 – 2012-01-22 (×3): 2.5 mg via ORAL
  Filled 2012-01-19 (×3): qty 1

## 2012-01-19 MED ORDER — METHOCARBAMOL 100 MG/ML IJ SOLN
500.0000 mg | Freq: Four times a day (QID) | INTRAVENOUS | Status: DC | PRN
  Filled 2012-01-19: qty 5

## 2012-01-19 MED ORDER — ACETAMINOPHEN 10 MG/ML IV SOLN
1000.0000 mg | Freq: Four times a day (QID) | INTRAVENOUS | Status: AC
  Administered 2012-01-19 – 2012-01-20 (×4): 1000 mg via INTRAVENOUS
  Filled 2012-01-19 (×5): qty 100

## 2012-01-19 MED ORDER — METOCLOPRAMIDE HCL 5 MG/ML IJ SOLN: 5.0000 mg | Freq: Three times a day (TID) | INTRAMUSCULAR | Status: DC | PRN

## 2012-01-19 MED ORDER — ACETAMINOPHEN 10 MG/ML IV SOLN
INTRAVENOUS | Status: DC | PRN
  Administered 2012-01-19: 1000 mg via INTRAVENOUS

## 2012-01-19 MED ORDER — DEXTROSE-NACL 5-0.9 % IV SOLN
INTRAVENOUS | Status: DC
  Administered 2012-01-19 – 2012-01-20 (×2): via INTRAVENOUS
  Administered 2012-01-20: 20 mL/h via INTRAVENOUS

## 2012-01-19 MED ORDER — DIPHENHYDRAMINE HCL 12.5 MG/5ML PO ELIX
12.5000 mg | ORAL_SOLUTION | ORAL | Status: DC | PRN
  Administered 2012-01-19: 12.5 mg via ORAL
  Filled 2012-01-19: qty 5

## 2012-01-19 MED ORDER — PROPOFOL 10 MG/ML IV EMUL
INTRAVENOUS | Status: DC | PRN
  Administered 2012-01-19: 75 ug/kg/min via INTRAVENOUS

## 2012-01-19 MED ORDER — CEFAZOLIN SODIUM-DEXTROSE 2-3 GM-% IV SOLR
2.0000 g | Freq: Once | INTRAVENOUS | Status: AC
  Administered 2012-01-19: 2 g via INTRAVENOUS

## 2012-01-19 MED ORDER — TEMAZEPAM 15 MG PO CAPS: 15.0000 mg | ORAL_CAPSULE | Freq: Every evening | ORAL | Status: DC | PRN

## 2012-01-19 MED ORDER — RIVAROXABAN 10 MG PO TABS
10.0000 mg | ORAL_TABLET | Freq: Every day | ORAL | Status: DC
  Administered 2012-01-20 – 2012-01-22 (×3): 10 mg via ORAL
  Filled 2012-01-19 (×3): qty 1

## 2012-01-19 MED ORDER — METOCLOPRAMIDE HCL 10 MG PO TABS: 5.0000 mg | ORAL_TABLET | Freq: Three times a day (TID) | ORAL | Status: DC | PRN

## 2012-01-19 SURGICAL SUPPLY — 51 items
BAG SPEC THK2 15X12 ZIP CLS (MISCELLANEOUS) ×1
BAG ZIPLOCK 12X15 (MISCELLANEOUS) ×2 IMPLANT
BANDAGE ELASTIC 6 VELCRO ST LF (GAUZE/BANDAGES/DRESSINGS) ×2 IMPLANT
BANDAGE ESMARK 6X9 LF (GAUZE/BANDAGES/DRESSINGS) ×1 IMPLANT
BLADE SAG 18X100X1.27 (BLADE) ×2 IMPLANT
BLADE SAW SGTL 11.0X1.19X90.0M (BLADE) ×2 IMPLANT
BNDG CMPR 9X6 STRL LF SNTH (GAUZE/BANDAGES/DRESSINGS) ×1
BNDG ESMARK 6X9 LF (GAUZE/BANDAGES/DRESSINGS) ×2
BOWL SMART MIX CTS (DISPOSABLE) ×2 IMPLANT
CATH KIT ON-Q SILVERSOAK 5IN (CATHETERS) ×2 IMPLANT
CEMENT HV SMART SET (Cement) ×4 IMPLANT
CLOTH BEACON ORANGE TIMEOUT ST (SAFETY) ×2 IMPLANT
CUFF TOURN SGL QUICK 34 (TOURNIQUET CUFF) ×2
CUFF TRNQT CYL 34X4X40X1 (TOURNIQUET CUFF) ×1 IMPLANT
DRAPE EXTREMITY T 121X128X90 (DRAPE) ×2 IMPLANT
DRAPE POUCH INSTRU U-SHP 10X18 (DRAPES) ×2 IMPLANT
DRAPE U-SHAPE 47X51 STRL (DRAPES) ×2 IMPLANT
DRSG ADAPTIC 3X8 NADH LF (GAUZE/BANDAGES/DRESSINGS) ×2 IMPLANT
DURAPREP 26ML APPLICATOR (WOUND CARE) ×2 IMPLANT
ELECT REM PT RETURN 9FT ADLT (ELECTROSURGICAL) ×2
ELECTRODE REM PT RTRN 9FT ADLT (ELECTROSURGICAL) ×1 IMPLANT
EVACUATOR 1/8 PVC DRAIN (DRAIN) ×2 IMPLANT
FACESHIELD LNG OPTICON STERILE (SAFETY) ×10 IMPLANT
GLOVE BIO SURGEON STRL SZ7.5 (GLOVE) ×2 IMPLANT
GLOVE BIO SURGEON STRL SZ8 (GLOVE) ×2 IMPLANT
GLOVE BIOGEL PI IND STRL 8 (GLOVE) ×2 IMPLANT
GLOVE BIOGEL PI INDICATOR 8 (GLOVE) ×2
GOWN STRL NON-REIN LRG LVL3 (GOWN DISPOSABLE) ×2 IMPLANT
GOWN STRL REIN XL XLG (GOWN DISPOSABLE) ×2 IMPLANT
HANDPIECE INTERPULSE COAX TIP (DISPOSABLE) ×2
IMMOBILIZER KNEE 20 (SOFTGOODS) ×2
IMMOBILIZER KNEE 20 THIGH 36 (SOFTGOODS) ×1 IMPLANT
KIT BASIN OR (CUSTOM PROCEDURE TRAY) ×2 IMPLANT
MANIFOLD NEPTUNE II (INSTRUMENTS) ×2 IMPLANT
NS IRRIG 1000ML POUR BTL (IV SOLUTION) ×2 IMPLANT
PACK TOTAL JOINT (CUSTOM PROCEDURE TRAY) ×2 IMPLANT
PAD ABD 7.5X8 STRL (GAUZE/BANDAGES/DRESSINGS) ×2 IMPLANT
PADDING CAST COTTON 6X4 STRL (CAST SUPPLIES) ×6 IMPLANT
POSITIONER SURGICAL ARM (MISCELLANEOUS) ×2 IMPLANT
SET HNDPC FAN SPRY TIP SCT (DISPOSABLE) ×1 IMPLANT
SPONGE GAUZE 4X4 12PLY (GAUZE/BANDAGES/DRESSINGS) ×2 IMPLANT
STRIP CLOSURE SKIN 1/2X4 (GAUZE/BANDAGES/DRESSINGS) ×4 IMPLANT
SUCTION FRAZIER 12FR DISP (SUCTIONS) ×2 IMPLANT
SUT MNCRL AB 4-0 PS2 18 (SUTURE) ×2 IMPLANT
SUT PDS AB 1 CT1 27 (SUTURE) ×6 IMPLANT
SUT VIC AB 2-0 CT1 27 (SUTURE) ×6
SUT VIC AB 2-0 CT1 TAPERPNT 27 (SUTURE) ×3 IMPLANT
TOWEL OR 17X26 10 PK STRL BLUE (TOWEL DISPOSABLE) ×4 IMPLANT
TRAY FOLEY CATH 14FRSI W/METER (CATHETERS) ×2 IMPLANT
WATER STERILE IRR 1500ML POUR (IV SOLUTION) ×2 IMPLANT
WRAP KNEE MAXI GEL POST OP (GAUZE/BANDAGES/DRESSINGS) ×4 IMPLANT

## 2012-01-19 NOTE — Anesthesia Preprocedure Evaluation (Addendum)
Anesthesia Evaluation  Patient identified by MRN, date of birth, ID band Patient awake    Reviewed: Allergy & Precautions, H&P , NPO status , Patient's Chart, lab work & pertinent test results  Airway Mallampati: II TM Distance: >3 FB Neck ROM: Full    Dental No notable dental hx.    Pulmonary neg pulmonary ROS,  clear to auscultation  Pulmonary exam normal       Cardiovascular hypertension, Regular Normal    Neuro/Psych Negative Neurological ROS  Negative Psych ROS   GI/Hepatic negative GI ROS, (+) Hepatitis -  Endo/Other  Morbid obesity  Renal/GU negative Renal ROS  Genitourinary negative   Musculoskeletal negative musculoskeletal ROS (+)   Abdominal   Peds negative pediatric ROS (+)  Hematology negative hematology ROS (+)   Anesthesia Other Findings   Reproductive/Obstetrics negative OB ROS                           Anesthesia Physical Anesthesia Plan  ASA: III  Anesthesia Plan: Spinal   Post-op Pain Management:    Induction:   Airway Management Planned: Simple Face Mask  Additional Equipment:   Intra-op Plan:   Post-operative Plan:   Informed Consent: I have reviewed the patients History and Physical, chart, labs and discussed the procedure including the risks, benefits and alternatives for the proposed anesthesia with the patient or authorized representative who has indicated his/her understanding and acceptance.   Dental advisory given  Plan Discussed with: CRNA  Anesthesia Plan Comments:         Anesthesia Quick Evaluation

## 2012-01-19 NOTE — H&P (View-Only) (Signed)
[name]  DOB: 09/23/1932 Married / Language: English / Race: White / Female  Date of Admission:  01/19/2012  Chief Complaint:  Left Knee Pain  History of Present Illness The patient is a 79 year old female who comes in today for a preoperative History and Physical. The patient is scheduled for a left total knee arthroplasty to be performed by Dr. Frank V. Aluisio, MD at [facility] on 01/19/2012. They have been treated conservatively in the past for the above stated problem and despite conservative measures, they continue to have progressive pain and severe functional limitations and dysfunction. They have failed non-operative management including home exercise, medications, and injections. It is felt that they would benefit from undergoing total joint replacement. Risks and benefits of the procedure have been discussed with the patient and they elect to proceed with surgery. There are no active contraindications to surgery such as ongoing infection or rapidly progressive neurological disease.  Allergies No Known Drug Allergies  Medication History Benazepril HCl (20MG Tablet, 2 Oral daily) Active. Indapamide (2.5MG Tablet, Oral daily) Active. Crestor (10MG Tablet, Oral three times a week) Active. Aspirin (81MG Tablet, Oral daily) Active. Tylenol Extra Strength (500MG Tablet, Oral four times daily, as needed) Active.  Problem List/Past Medical Hypertension Hypercholesterolemia Varicose veins Hepatitis A. 1979  Past Surgical History Rotator Cuff Repair - Right Total Knee Replacement - Right. Date: 2008. D & C Procedure. times 2  Family History Father. Deceased, Prostate Cancer. age 93 Mother. Deceased, Cerebrovascular Accident. age 87 Brother 1. Deceased. age 79, Pancreatic Cancer  Social History Tobacco use. Never smoker. Alcohol use. Occasional alcohol use. glass of wine Marital status. Married. Children. 3 Living situation. Lives with  spouse. Post-Surgical Plans. Plans for [facility] Advance Directives. Living Will and Healthcare POA  Review of Systems General:Not Present- Chills, Fever, Night Sweats, Fatigue, Weight Gain, Weight Loss and Memory Loss. Skin:Not Present- Hives, Itching, Rash, Eczema and Lesions. HEENT:Not Present- Tinnitus, Headache, Double Vision, Visual Loss, Hearing Loss and Dentures. Respiratory:Not Present- Shortness of breath with exertion, Shortness of breath at rest, Allergies, Coughing up blood and Chronic Cough. Cardiovascular:Not Present- Chest Pain, Racing/skipping heartbeats, Difficulty Breathing Lying Down, Murmur, Swelling and Palpitations. Gastrointestinal:Not Present- Bloody Stool, Heartburn, Abdominal Pain, Vomiting, Nausea, Constipation, Diarrhea, Difficulty Swallowing, Jaundice and Loss of appetitie. Female Genitourinary:Present- Urinating at Night. Not Present- Blood in Urine, Urinary frequency, Weak urinary stream, Discharge, Flank Pain, Incontinence, Painful Urination, Urgency and Urinary Retention. Musculoskeletal:Present- Morning Stiffness. Not Present- Muscle Weakness, Muscle Pain, Joint Swelling, Joint Pain, Back Pain and Spasms. Neurological:Not Present- Tremor, Dizziness, Blackout spells, Paralysis, Difficulty with balance and Weakness. Psychiatric:Present- Insomnia.  Vitals Weight: 181 lb Height: 66 in Body Surface Area: 1.96 m Body Mass Index: 29.21 kg/m Pulse: 72 (Regular) Resp.: 14 (Unlabored) BP: 134/80 (Sitting, Right Arm, Standard)  Physical Exam The physical exam findings are as follows: Patient is a 79 year old female with continued knee pain.  General Mental Status - Alert, cooperative and good historian. General Appearance- pleasant. Not in acute distress. Orientation- Oriented X3. Build & Nutrition- Well nourished and Well developed.  Head and Neck Head- normocephalic, atraumatic . Neck Global Assessment- supple. no bruit  auscultated on the right and no bruit auscultated on the left.  Eye Pupil- Bilateral- Regular and Round. Motion- Bilateral- EOMI.  Chest and Lung Exam Auscultation: Breath sounds:- clear at anterior chest wall and - clear at posterior chest wall. Adventitious sounds:- No Adventitious sounds.  Cardiovascular Auscultation:Rhythm- Regular rate and rhythm. Heart   Sounds- S1 WNL and S2 WNL. Murmurs & Other Heart Sounds:Auscultation of the heart reveals - No Murmurs.  Abdomen Palpation/Percussion:Tenderness- Abdomen is non-tender to palpation. Rigidity (guarding)- Abdomen is soft. Auscultation:Auscultation of the abdomen reveals - Bowel sounds normal.  Female Genitourinary  Not done, not pertinent to present illness  Musculoskeletal Right knee looks great. Range 0 to 125 degrees. There is no swelling, tenderness or instability. No crepitus on range of motion. The left knee shows valgus deformity. Range is about 5 to 120. There is marked crepitus on range of motion. She is tender lateral greater than medial. There is no instability.  RADIOGRAPHS: X-rays taken today, AP and lateral of both, show that the prosthesis on the right is in excellent position with no periprosthetic abnormalities. On the left she has endstage arthritis, bone on bone lateral and patellofemoral compartments. She has about a 10 degree valgus deformity.  Assessment & Plan Osteoarthritis Left Knee  Patient is for a Left Total Knee Replacement by Dr. Aluisio.  Plan is for [facility] following surgery.  Drew Perkins, PA-C   PCP - Dr. Joh Russo   

## 2012-01-19 NOTE — Interval H&P Note (Signed)
History and Physical Interval Note:  01/19/2012 9:58 AM  [name]  has presented today for surgery, with the diagnosis of osteoarthritis left knee  The various methods of treatment have been discussed with the patient and family. After consideration of risks, benefits and other options for treatment, the patient has consented to  Procedure(s): TOTAL KNEE ARTHROPLASTY as a surgical intervention .  The patients' history has been reviewed, patient examined, no change in status, stable for surgery.  I have reviewed the patients' chart and labs.  Questions were answered to the patient's satisfaction.     Loanne Drilling

## 2012-01-19 NOTE — Anesthesia Procedure Notes (Signed)
Spinal  Patient location during procedure: OR Start time: 01/19/2012 10:14 AM End time: 01/19/2012 10:14 AM Staffing Performed by: anesthesiologist  Preanesthetic Checklist Completed: patient identified, site marked, surgical consent, pre-op evaluation, timeout performed, IV checked, risks and benefits discussed and monitors and equipment checked Spinal Block Patient position: sitting Prep: Betadine Patient monitoring: heart rate, continuous pulse ox and blood pressure Injection technique: single-shot Needle Needle type: Spinocan  Needle gauge: 22 G Needle length: 9 cm Assessment Sensory level: T8 Additional Notes Expiration date of kit checked and confirmed. Patient tolerated procedure well, without complications.

## 2012-01-19 NOTE — Preoperative (Signed)
Beta Blockers   Reason not to administer Beta Blockers:Not Applicable 

## 2012-01-19 NOTE — Op Note (Signed)
Pre-operative diagnosis- Osteoarthritis Left knee(s)  Post-operative diagnosis- Osteoarthritis  Left knee(s)  Procedure-   Left Total Knee Arthroplasty  Surgeon- Gus Rankin. Skyylar Kopf, MD  Assistant- Avel Peace, PA-C   Anesthesia-  Spinal   EBL- * No blood loss amount entered *   Drains Hemovac   Tourniquet time  Total Tourniquet Time Documented: Thigh (Left) - 34 minutes   Complications- None  Condition-PACU - hemodynamically stable.   Brief Clinical Note  [name] is a 78 y.o. year old female with end stage OA of her left knee with progressively worsening pain and dysfunction. She has constant pain, with activity and at rest and significant functional deficits with difficulties even with ADLs. She has had extensive non-op management including analgesics, injections of cortisone and viscosupplements, and home exercise program, but remains in significant pain with significant dysfunction. Radiographs show bone on bone arthritis lateral compartment with valgus deformity and tibial bony erosion/sclerosis. She presents now for left Total Knee Arthroplasty.    Procedure in detail---      The patient is brought into the operating room and positioned supine on the operating table. After successful administration of Spinal anesthetic, a tourniquet is placed high on the Left thigh(s) and the lower extremity is prepped and draped in the usual sterile fashion. Time out is performed by the operating team and then the Left  lower extremity is wrapped in Esmarch, knee flexed and the tourniquet inflated to 300 mmHg.       A midline incision is made with a ten blade through the subcutaneous tissue to the level of the extensor mechanism. A fresh blade is used to make a lateral parapatellar arthrotomy due to the patients' valgus deformity. Soft tissue over the proximal lateral tibia is subperiosteally elevated to the joint line with a knife to the posterolateral corner but not including the structures  of the posterolateral corner. Soft tissue over the proximal medial tibia is elevated with attention being paid to avoiding the patellar tendon on the tibial tubercle. The patella is everted medially, knee flexed 90 degrees and the ACL and PCL are removed. Findings are bone on bone lateral and patellofemoral with valgus deformity and large osteophytes .       The drill is used to create a starting hole in the distal femur and the canal is thoroughly irrigated with sterile saline to remove the fatty contents. The 5 degree Left  valgus alignment guide is placed into the femoral canal and the distal femoral cutting block is pinned to remove 11  mm off the distal femur. Resection is made with an oscillating saw.      The tibia is subluxed forward and the menisci are removed. The extramedullary alignment guide is placed referencing proximally at the medial aspect of the tibial tubercle and distally along the second metatarsal axis and tibial crest. The block is pinned to remove 2mm off the more deficient lateral side. Resection is made with an oscillating saw. Size 2.5  is the most appropriate size for the tibia and the proximal tibia is prepared with the modular drill and keel punch for that size.      The femoral sizing guide is placed and size 3  is most appropriate. Rotation is marked off the epicondylar axis and confirmed by creating a rectangular flexion gap at 90 degrees. The size 3  cutting block is pinned in this rotation and the anterior, posterior and chamfer cuts are made with the oscillating saw. The intercondylar block  is then placed and that cut is made.      Trial size 2.5  tibial component, trial size 3  posterior stabilized femur and a 12.5  mm posterior stabilized rotating platform insert trial is placed. Full extension is achieved with excellent varus/valgus and   anterior/posterior balance throughout full range of motion. The patella is everted and thickness measured to be 22  mm. Free hand  resection is taken to 12 mm, a 35 template is placed, lug holes are drilled, trial patella is placed, and it tracks normally. Osteophytes are removed off the posterior femur with the trial in place. All trials are removed and the cut bone surfaces prepared with pulsatile lavage. Cement is mixed and once ready for implantation, the size 2.5  tibial implant, size 3 posterior stabilized femoral component, and the size 35  patella are cemented in place and the patella is held with the clamp. The trial insert is placed and the knee held in full extension. All extruded cement is removed and once the cement is hard the permanent 12.5  mm posterior stabilized rotating platform insert is placed into the tibial tray.      The wound is copiously irrigated with saline solution and the tourniquet is released for a total   tourniquet time of 34  minutes. Bleeding is identified and controlled with electrocautery. The extensor mechanism is closed with interrupted #1 PDS leaving open a small area from the superior to inferior pole of the patella to serve as a mini lateral release. Flexion against gravity is 135  degrees and the patella tracks normally. Subcutaneous tissue is closed with 2.0 vicryl and subcuticular with running 4.0 Monocryl. The catheter for the Marcaine pain pump is placed and the pump is initiated. The incision is cleaned and dried and steri-strips and a bulky sterile dressing are applied. The limb is placed into a knee immobilizer and the patient is awakened and transported to recovery in stable condition.      Please note that a surgical assistant was a medical necessity for this procedure in order to perform it in a safe and expeditious manner. Surgical assistant was necessary to retract the ligaments and vital neurovascular structures to prevent injury to them and also necessary for proper positioning of the limb to allow for anatomic placement of the prosthesis.    Gus Rankin Melissa Hege, MD    01/19/2012,  11:14 AM

## 2012-01-19 NOTE — Plan of Care (Signed)
Problem: Consults Goal: Diagnosis- Total Joint Replacement Right total knee     

## 2012-01-19 NOTE — Anesthesia Postprocedure Evaluation (Addendum)
  Anesthesia Post-op Note  Patient: [name]  Procedure(s) Performed:  TOTAL KNEE ARTHROPLASTY  Patient Location: PACU  Anesthesia Type: spinal  Level of Consciousness: awake and alert   Airway and Oxygen Therapy: Patient Spontanous Breathing  Post-op Pain: mild  Post-op Assessment: Post-op Vital signs reviewed, Patient's Cardiovascular Status Stable, Respiratory Function Stable, Patent Airway and No signs of Nausea or vomiting  Post-op Vital Signs: stable  Complications: No apparent anesthesia complications

## 2012-01-19 NOTE — Transfer of Care (Signed)
Immediate Anesthesia Transfer of Care Note  Patient: [name]  Procedure(s) Performed:  TOTAL KNEE ARTHROPLASTY  Patient Location: PACU  Anesthesia Type: MAC and Spinal  Level of Consciousness: awake, alert , oriented and patient cooperative  Airway & Oxygen Therapy: Patient Spontanous Breathing and Patient connected to face mask oxygen  Post-op Assessment: Report given to PACU RN and Post -op Vital signs reviewed and stable  Post vital signs: Reviewed and stable  Complications: No apparent anesthesia complications

## 2012-01-20 LAB — CBC
MCHC: 33.3 g/dL (ref 30.0–36.0)
Platelets: 130 10*3/uL — ABNORMAL LOW (ref 150–400)
RDW: 13 % (ref 11.5–15.5)
WBC: 5.5 10*3/uL (ref 4.0–10.5)

## 2012-01-20 LAB — BASIC METABOLIC PANEL
Chloride: 104 mEq/L (ref 96–112)
Creatinine, Ser: 1.01 mg/dL (ref 0.50–1.10)
GFR calc Af Amer: 61 mL/min — ABNORMAL LOW (ref >90)
GFR calc non Af Amer: 52 mL/min — ABNORMAL LOW (ref >90)
Potassium: 4.1 mEq/L (ref 3.5–5.1)

## 2012-01-20 MED ORDER — HYDROCODONE-ACETAMINOPHEN 5-325 MG PO TABS
1.0000 | ORAL_TABLET | ORAL | Status: DC | PRN
  Administered 2012-01-20: 1 via ORAL
  Administered 2012-01-21 – 2012-01-22 (×4): 2 via ORAL
  Administered 2012-01-22: 1 via ORAL
  Filled 2012-01-20: qty 1
  Filled 2012-01-20 (×5): qty 2

## 2012-01-20 NOTE — Evaluation (Signed)
Physical Therapy Evaluation Patient Details Name: [name] MRN: [id number] DOB: 1932/11/04 Today's Date: 01/20/2012  Problem List: There is no problem list on file for this patient.  [phone] [id number] Past Medical History:  Past Medical History  Diagnosis Date  . Hypertension   . Elevated cholesterol   . Hepatitis 1979    HEPATITIS A WITH JAUNDICE  . Varicose veins   . Complication of anesthesia     SLOW TO AWAKE AFTER 2008 SURGERY, PREFERS SPINAL IF POSSIBLE   Past Surgical History:  Past Surgical History  Procedure Date  . Dilation and curettage of uterus YRS AGO    X 2  . Right rotator cuff repair 2005  . Right total knee replacment 2008    PT Assessment/Plan/Recommendation PT Assessment Clinical Impression Statement: pt started out feeling well, became ight headed and nauseated BP 115/74 97% ra 74 HR, felt better after rest in chair.  pt can benefit from PT to improve in [place], rom and strength to DC to SNF PT Recommendation/Assessment: Patient will need skilled PT in the acute care venue PT Problem List: Decreased strength;Decreased range of motion;Decreased activity tolerance;Decreased knowledge of use of DME;Pain PT Therapy Diagnosis : Difficulty walking;Acute pain PT Plan PT Frequency: 7X/week PT Treatment/Interventions: DME instruction;Gait training;Functional mobility training;Therapeutic activities;Therapeutic exercise;Patient/family education PT Recommendation Recommendations for Other Services: OT consult Follow Up Recommendations: Skilled nursing facility Equipment Recommended: None recommended by PT PT Goals  Acute Rehab PT Goals PT Goal Formulation: With patient Time For Goal Achievement: 7 days Pt will go Supine/Side to Sit: with supervision PT Goal: Supine/Side to Sit - Progress: Goal set today Pt will go Sit to Supine/Side: with supervision PT Goal: Sit to Supine/Side - Progress: Goal set today Pt will go Sit to Stand: with supervision PT Goal:  Sit to Stand - Progress: Goal set today Pt will go Stand to Sit: with supervision PT Goal: Stand to Sit - Progress: Goal set today Pt will Ambulate: 51 - 150 feet;with supervision;with rolling walker PT Goal: Ambulate - Progress: Goal set today Pt will Perform Home Exercise Program: with supervision, verbal cues required/provided PT Goal: Perform Home Exercise Program - Progress: Goal set today  PT Evaluation Precautions/Restrictions  Precautions Precautions: Knee Required Braces or Orthoses: Yes Restrictions Weight Bearing Restrictions: No Prior Functioning  Home Living Lives With: Spouse Receives Help From: Family Type of Home: House (pt plans snf ) Prior Function Level of Independence: Independent with basic ADLs Cognition Cognition Arousal/Alertness: Awake/alert Sensation/Coordination Sensation Light Touch: Appears Intact Coordination Gross Motor Movements are Fluid and Coordinated: Yes Extremity Assessment RLE Assessment RLE Assessment: Within Functional Limits LLE Assessment LLE Assessment: Exceptions to WFL LLE AROM (degrees) LLE Overall AROM Comments: knee flex to 30 LLE Strength LLE Overall Strength Comments: unable to SLR Mobility (including Balance) Bed Mobility Bed Mobility: Yes Supine to Sit: 4: Min assist;With rails Transfers Transfers: Yes Sit to Stand: 4: Min assist;From elevated surface;With upper extremity assist;From bed Sit to Stand Details (indicate cue type and reason): vc to push from bed Stand to Sit: 4: Min assist;With armrests;To chair/3-in-1 Stand to Sit Details: support LLE as pt sat, vc for hand placement Ambulation/Gait Ambulation/Gait: Yes Ambulation/Gait Assistance: 3: Mod assist Ambulation/Gait Assistance Details (indicate cue type and reason): antalgic LLE, became nauseated Ambulation Distance (Feet): 8 Feet Assistive device: Rolling walker Gait Pattern: Step-to pattern;Decreased stance time - left    Exercise  Total Joint  Exercises Ankle Circles/Pumps: AROM;Left;10 reps;Supine Quad Sets: AROM;Left;10 reps;Supine Heel Slides: AAROM;10 reps;Supine;Left  End of Session PT - End of Session Equipment Utilized During Treatment: Left knee immobilizer Activity Tolerance: Patient limited by pain (nausea) Patient left: in bed;with call bell in reach Nurse Communication: Mobility status for transfers General Behavior During Session: [facility] for tasks performed Cognition: [facility] for tasks performed  Rada Hay 01/20/2012, 4:53 PM

## 2012-01-20 NOTE — Progress Notes (Signed)
Utilization review completed.  

## 2012-01-20 NOTE — Progress Notes (Signed)
Physical Therapy Treatment Patient Details Name: [name] MRN: [id number] DOB: [birth year] Today's Date: 01/20/2012 1500-1537e g  PT Assessment/Plan  PT - Assessment/Plan Comments on Treatment Session: pt with better pain control and not dizzy PT Plan: Discharge plan remains appropriate PT Frequency: 7X/week Recommendations for Other Services: OT consult Follow Up Recommendations: Skilled nursing facility Equipment Recommended: None recommended by PT PT Goals  Acute Rehab PT Goals PT Goal Formulation: With patient Time For Goal Achievement: 7 days Pt will go Supine/Side to Sit: with supervision PT Goal: Supine/Side to Sit - Progress: Progressing toward goal Pt will go Sit to Supine/Side: with supervision PT Goal: Sit to Supine/Side - Progress: Progressing toward goal Pt will go Sit to Stand: with supervision PT Goal: Sit to Stand - Progress: Progressing toward goal Pt will go Stand to Sit: with supervision PT Goal: Stand to Sit - Progress: Progressing toward goal Pt will Ambulate: 51 - 150 feet;with supervision;with rolling walker PT Goal: Ambulate - Progress: Progressing toward goal Pt will Perform Home Exercise Program: with supervision, verbal cues required/provided PT Goal: Perform Home Exercise Program - Progress: Progressing toward goal  PT Treatment Precautions/Restrictions  Precautions Precautions: Knee Required Braces or Orthoses: Yes Restrictions Weight Bearing Restrictions: No Mobility (including Balance) Bed Mobility Bed Mobility: Yes  Sit to Supine - Details (indicate cue type and reason): assist LLE onto bed Transfers Transfers: Yes Sit to Stand: 4: Min assist;With upper extremity assist;From chair/3-in-1 Sit to Stand Details (indicate cue type and reason): vc to push from bed Stand to Sit: 4: Min assist;To bed;With upper extremity assist Stand to Sit Details: support LLE as pt sat, vc for hand placement Ambulation/Gait Ambulation/Gait:  Yes Ambulation/Gait Assistance: 3: Mod assist Ambulation/Gait Assistance Details (indicate cue type and reason): antalgic LLE, became nauseated Ambulation Distance (Feet): 12 Feet Assistive device: Rolling walker Gait Pattern: Step-to pattern;Decreased stance time - left    Exercise  Total Joint Exercises Ankle Circles/Pumps: AROM;Left;10 reps;Supine Quad Sets: AROM;Left;10 reps;Supine Heel Slides: AAROM;10 reps;Supine;Left End of Session PT - End of Session Equipment Utilized During Treatment: Left knee immobilizer Activity Tolerance: Patient limited by pain (nausea) Patient left: in bed;with call bell in reach Nurse Communication: Mobility status for transfers General Behavior During Session: [facility] for tasks performed Cognition: [facility] for tasks performed  Rada Hay 01/20/2012, 4:58 PM

## 2012-01-20 NOTE — Progress Notes (Signed)
Subjective: 1 Day Post-Op Procedure(s) (LRB): TOTAL KNEE ARTHROPLASTY (Left) Patient reports pain as mild.   Patient seen in rounds with Dr. Lequita Halt. Patient doing OK on day one. We will start therapy today. Plan is to go [facility] after hospital stay.  Objective: Vital signs in last 24 hours: Temp:  [96.8 F (36 C)-98.6 F (37 C)] 98.3 F (36.8 C) (01/24 0521) Pulse Rate:  [63-94] 76  (01/24 0521) Resp:  [12-18] 12  (01/24 0521) BP: (102-145)/(57-77) 113/73 mmHg (01/24 0521) SpO2:  [97 %-100 %] 99 % (01/24 0521) Weight:  [83.462 kg (184 lb)] 83.462 kg (184 lb) (01/23 1315)  Intake/Output from previous day:  Intake/Output Summary (Last 24 hours) at 01/20/12 0835 Last data filed at 01/20/12 0550  Gross per 24 hour  Intake 4130.82 ml  Output   3415 ml  Net 715.82 ml    Intake/Output this shift:    Labs:  Basename 01/20/12 0410  HGB 10.8*    Basename 01/20/12 0410  WBC 5.5  RBC 3.46*  HCT 32.4*  PLT 130*    Basename 01/20/12 0410  NA 139  K 4.1  CL 104  CO2 28  BUN 16  CREATININE 1.01  GLUCOSE 128*  CALCIUM 8.8   No results found for this basename: LABPT:2,INR:2 in the last 72 hours  Exam - Neurovascular intact Sensation intact distally Dressing - clean, dry, no drainage Motor function intact - moving foot and toes well on exam.  Hemovac pulled without difficulty.  Past Medical History  Diagnosis Date  . Hypertension   . Elevated cholesterol   . Hepatitis 1979    HEPATITIS A WITH JAUNDICE  . Varicose veins   . Complication of anesthesia     SLOW TO AWAKE AFTER 2008 SURGERY, PREFERS SPINAL IF POSSIBLE    Assessment/Plan: 1 Day Post-Op Procedure(s) (LRB): TOTAL KNEE ARTHROPLASTY (Left) Active Problems:  * No active hospital problems. *    Advance diet Up with therapy Discharge to SNF - [facility]  DVT Prophylaxis - Xarelto  Protocol Weight-Bearing as tolerated to left leg Keep foley until tomorrow. No vaccines. D/C O2 and Pulse  OX and try on [address]  Patrica Duel 01/20/2012, 8:35 AM

## 2012-01-20 NOTE — Progress Notes (Signed)
FL2 in shadow chart for MD signature. Pt plans to ST rehab at [facility]. SNF has requested D/C Summary on Fri if Sat d/c is planned. CSW will assist with d/c planning to SNF.

## 2012-01-21 ENCOUNTER — Encounter (HOSPITAL_COMMUNITY): Payer: Self-pay | Admitting: Orthopedic Surgery

## 2012-01-21 LAB — BASIC METABOLIC PANEL
BUN: 11 mg/dL (ref 6–23)
Chloride: 102 mEq/L (ref 96–112)
GFR calc Af Amer: 62 mL/min — ABNORMAL LOW (ref >90)
GFR calc non Af Amer: 54 mL/min — ABNORMAL LOW (ref >90)
Potassium: 3.6 mEq/L (ref 3.5–5.1)
Sodium: 137 mEq/L (ref 135–145)

## 2012-01-21 LAB — CBC
HCT: 31.7 % — ABNORMAL LOW (ref 36.0–46.0)
Hemoglobin: 10.7 g/dL — ABNORMAL LOW (ref 12.0–15.0)
RDW: 12.9 % (ref 11.5–15.5)
WBC: 7.6 10*3/uL (ref 4.0–10.5)

## 2012-01-21 MED ORDER — HYDROCODONE-ACETAMINOPHEN 5-325 MG PO TABS: 1.0000 | ORAL_TABLET | ORAL | Status: AC | PRN

## 2012-01-21 MED ORDER — ACETAMINOPHEN 325 MG PO TABS: 650.0000 mg | ORAL_TABLET | Freq: Four times a day (QID) | ORAL | Status: AC | PRN

## 2012-01-21 MED ORDER — ONDANSETRON HCL 4 MG PO TABS: 4.0000 mg | ORAL_TABLET | Freq: Four times a day (QID) | ORAL | Status: AC | PRN

## 2012-01-21 MED ORDER — DSS 100 MG PO CAPS: 100.0000 mg | ORAL_CAPSULE | Freq: Two times a day (BID) | ORAL | Status: AC

## 2012-01-21 MED ORDER — RIVAROXABAN 10 MG PO TABS: 10.0000 mg | ORAL_TABLET | Freq: Every day | ORAL | Status: DC

## 2012-01-21 MED ORDER — METHOCARBAMOL 500 MG PO TABS: 500.0000 mg | ORAL_TABLET | Freq: Four times a day (QID) | ORAL | Status: AC | PRN

## 2012-01-21 NOTE — Progress Notes (Signed)
Physical Therapy Treatment Patient Details Name: [name] MRN: [id number] DOB: September 17, 1932 Today's Date: 01/21/2012 1450-1522 g e PT Assessment/Plan  PT - Assessment/Plan Comments on Treatment Session: pt with better pain control and not dizzy, progressing with gait and mobility PT Plan: Discharge plan remains appropriate PT Frequency: 7X/week Recommendations for Other Services: OT consult Follow Up Recommendations: Skilled nursing facility Equipment Recommended: None recommended by PT PT Goals  Acute Rehab PT Goals PT Goal Formulation: With patient Time For Goal Achievement: 7 days Pt will go Supine/Side to Sit: with supervision Pt will go Sit to Supine/Side: with supervision PT Goal: Sit to Supine/Side - Progress: Progressing toward goal Pt will go Sit to Stand: with supervision PT Goal: Sit to Stand - Progress: Progressing toward goal Pt will go Stand to Sit: with supervision PT Goal: Stand to Sit - Progress: Progressing toward goal Pt will Ambulate: 51 - 150 feet;with supervision;with rolling walker PT Goal: Ambulate - Progress: Progressing toward goal Pt will Perform Home Exercise Program: with supervision, verbal cues required/provided PT Goal: Perform Home Exercise Program - Progress: Progressing toward goal  PT Treatment Precautions/Restrictions  Precautions Precautions: Knee Required Braces or Orthoses: Yes Knee Immobilizer: Discontinue once straight leg raise with < 10 degree lag Restrictions Weight Bearing Restrictions: No Mobility (including Balance) Bed Mobility Bed Mobility: Yes Sit to Supine: 4: Min assist Sit to Supine - Details (indicate cue type and reason): assist LLE onto bed Transfers Transfers: Yes Sit to Stand: 4: Min assist;With upper extremity assist;From chair/3-in-1;With armrests Sit to Stand Details (indicate cue type and reason): vc to push from armrests Stand to Sit: 4: Min assist;To bed;With upper extremity assist Stand to Sit Details:  support LLE as pt sat, vc for hand placement Ambulation/Gait Ambulation/Gait: Yes Ambulation/Gait Assistance: 4: Min assist Ambulation/Gait Assistance Details (indicate cue type and reason): pt taking longer steps with more weight  Ambulation Distance (Feet): 15 Feet (x 2) Assistive device: Rolling walker Gait Pattern: Step-to pattern;Decreased stance time - left    Exercise  Total Joint Exercises Ankle Circles/Pumps: AROM;Left;10 reps;Supine Quad Sets: AROM;Left;10 reps;Supine Heel Slides: AAROM;10 reps;Supine;Left Hip ABduction/ADduction: AAROM;Left;10 reps;Supine Straight Leg Raises: AAROM;Left;10 reps;Seated End of Session PT - End of Session Equipment Utilized During Treatment: Left knee immobilizer Activity Tolerance: Patient tolerated treatment well (nausea) Patient left: in bed;with call bell in reach Nurse Communication: Mobility status for transfers General Behavior During Session: [facility] for tasks performed Cognition: [facility] for tasks performed  Rada Hay 01/21/2012, 3:42 PM

## 2012-01-21 NOTE — Progress Notes (Signed)
Subjective: 2 Days Post-Op Procedure(s) (LRB): TOTAL KNEE ARTHROPLASTY (Left) Patient reports pain as moderate.   Patient seen in rounds with Dr. Lequita Halt. Patient has complaints of knee pain post-therapy yesterday  Objective: Vital signs in last 24 hours: Temp:  [98.4 F (36.9 C)-99.4 F (37.4 C)] 99.4 F (37.4 C) (01/25 0645) Pulse Rate:  [74-88] 88  (01/25 0645) Resp:  [16] 16  (01/25 0645) BP: (104-163)/(61-85) 163/82 mmHg (01/25 0915) SpO2:  [92 %-95 %] 94 % (01/25 0850)  Intake/Output from previous day:  Intake/Output Summary (Last 24 hours) at 01/21/12 1129 Last data filed at 01/21/12 1000  Gross per 24 hour  Intake 2414.25 ml  Output    600 ml  Net 1814.25 ml    Intake/Output this shift: Total I/O In: 500 [P.O.:240; I.V.:60; Other:200] Out: -   Labs:  Basename 01/21/12 0355 01/20/12 0410  HGB 10.7* 10.8*    Basename 01/21/12 0355 01/20/12 0410  WBC 7.6 5.5  RBC 3.38* 3.46*  HCT 31.7* 32.4*  PLT 128* 130*    Basename 01/21/12 0355 01/20/12 0410  NA 137 139  K 3.6 4.1  CL 102 104  CO2 26 28  BUN 11 16  CREATININE 0.99 1.01  GLUCOSE 147* 128*  CALCIUM 8.9 8.8   No results found for this basename: LABPT:2,INR:2 in the last 72 hours  Exam - Neurologically intact Neurovascular intact Dorsiflexion/Plantar flexion intact Incision: dressing C/D/I No cellulitis present Compartment soft Dressing/Incision - clean, dry, no drainage Motor function intact - moving foot and toes well on exam.    Past Medical History  Diagnosis Date  . Hypertension   . Elevated cholesterol   . Hepatitis 1979    HEPATITIS A WITH JAUNDICE  . Varicose veins   . Complication of anesthesia     SLOW TO AWAKE AFTER 2008 SURGERY, PREFERS SPINAL IF POSSIBLE    Assessment/Plan: 2 Days Post-Op Procedure(s) (LRB): TOTAL KNEE ARTHROPLASTY (Left) Active Problems:  * No active hospital problems. *    Advance diet Up with therapy D/C IV fluids Discharge to SNF  tomorrow  DVT Prophylaxis - Xarelto  Protocol Weight-Bearing as tolerated to left leg  Keonda Dow V 01/21/2012, 11:29 AM

## 2012-01-21 NOTE — Discharge Summary (Addendum)
Physician Discharge Summary   Patient ID: [name] MRN: [id number] DOB/AGE: March 03, 1932 78 y.o.  Admit date: 01/19/2012 Discharge date: 01/21/2011 (Tentative date of discharge)  Primary Diagnosis: Osteoarthritis Left Knee  Admission Diagnoses: Past Medical History  Diagnosis Date  . Hypertension   . Elevated cholesterol   . Hepatitis 1979    HEPATITIS A WITH JAUNDICE  . Varicose veins   . Complication of anesthesia     SLOW TO AWAKE AFTER 2008 SURGERY, PREFERS SPINAL IF POSSIBLE    Discharge Diagnoses:  Active Problems:  * No active hospital problems. *   Procedure: Procedure(s) (LRB): TOTAL KNEE ARTHROPLASTY (Left)   Consults: None  HPI:  ANALEIA [name] is a 78 y.o. year old female with end stage OA of her left knee with progressively worsening pain and dysfunction. She has constant pain, with activity and at rest and significant functional deficits with difficulties even with ADLs. She has had extensive non-op management including analgesics, injections of cortisone and viscosupplements, and home exercise program, but remains in significant pain with significant dysfunction. Radiographs show bone on bone arthritis lateral compartment with valgus deformity and tibial bony erosion/sclerosis. She presents now for left Total Knee Arthroplasty.   Laboratory Data: Hospital Outpatient Visit on 01/12/2012  Component Date Value Range Status  . aPTT (seconds) 01/12/2012 32  24-37 Final  . Prothrombin Time (seconds) 01/12/2012 13.3  11.6-15.2 Final  . INR  01/12/2012 0.99  0.00-1.49 Final  . MRSA, PCR  01/12/2012 NEGATIVE  NEGATIVE Final  . Staphylococcus aureus  01/12/2012 NEGATIVE  NEGATIVE Final   Comment:                                 The Xpert SA Assay (FDA                          approved for NASAL specimens                          only), is one component of                          a comprehensive surveillance                          program.  It is not intended                            to diagnose infection nor to                          guide or monitor treatment.  . Sodium (mEq/L) 01/12/2012 140  135-145 Final  . Potassium (mEq/L) 01/12/2012 4.1  3.5-5.1 Final  . Chloride (mEq/L) 01/12/2012 102  96-112 Final  . CO2 (mEq/L) 01/12/2012 28  19-32 Final  . Glucose, Bld (mg/dL) 40/98/1191 85  47-82 Final  . BUN (mg/dL) 95/62/1308 27* 6-57 Final  . Creatinine, Ser (mg/dL) 84/69/6295 2.84* 1.32-4.40 Final  . Calcium (mg/dL) 10/23/2535 64.4  0.3-47.4 Final  . Total Protein (g/dL) 25/95/6387 7.4  5.6-4.3 Final  . Albumin (g/dL) 32/95/1884 4.1  1.6-6.0 Final  . AST (U/L) 01/12/2012 21  0-37 Final  . ALT (U/L) 01/12/2012 20  0-35 Final  .  Alkaline Phosphatase (U/L) 01/12/2012 85  39-117 Final  . Total Bilirubin (mg/dL) 16/09/9603 0.5  5.4-0.9 Final  . GFR calc non Af Amer (mL/min) 01/12/2012 46* >90 Final  . GFR calc Af Amer (mL/min) 01/12/2012 53* >90 Final   Comment:                                 The eGFR has been calculated                          using the CKD EPI equation.                          This calculation has not been                          validated in all clinical                          situations.                          eGFR's persistently                          <90 mL/min signify                          possible Chronic Kidney Disease.    Basename 01/21/12 0355 01/20/12 0410  HGB 10.7* 10.8*    Basename 01/21/12 0355 01/20/12 0410  WBC 7.6 5.5  RBC 3.38* 3.46*  HCT 31.7* 32.4*  PLT 128* 130*    Basename 01/21/12 0355 01/20/12 0410  NA 137 139  K 3.6 4.1  CL 102 104  CO2 26 28  BUN 11 16  CREATININE 0.99 1.01  GLUCOSE 147* 128*  CALCIUM 8.9 8.8   No results found for this basename: LABPT:2,INR:2 in the last 72 hours  X-Rays:No results found.  EKG: Orders placed during the hospital encounter of 01/12/12  . EKG 12-LEAD  . EKG 12-LEAD     Hospital Course: Patient was admitted to [facility] and taken to the OR and underwent the above state procedure without complications.  Patient tolerated the procedure well and was later transferred to the recovery room and then to the orthopaedic floor for postoperative care.  They were given PO and IV analgesics for pain control following their surgery.  They were given 24 hours of postoperative antibiotics and started on DVT prophylaxis.   PT and OT were ordered for total joint protocol.  Discharge planning consulted to help with postop disposition and equipment needs.  Patient had a decent night on the evening of surgery and started to get up with therapy on day one.  Hemovac drain was pulled without difficulty.  Continued to progress with therapy into day two.  Dressing was changed on day two and the incision was healing well. The patient was doing well on day two and it decided that if the patient continued to improve and if the bed was available, she would be able to be transferred over to [facility] the next day, day three. At this time arrangements being made to transfer tomorrow.    Discharge Medications: Prior to Admission  medications   Medication Sig Start Date End Date Taking? Authorizing Provider  benazepril (LOTENSIN) 20 MG tablet Take 20 mg by mouth 2 (two) times daily.   Yes Historical Provider, MD  indapamide (LOZOL) 2.5 MG tablet Take 2.5 mg by mouth daily before breakfast.   Yes Historical Provider, MD  rosuvastatin (CRESTOR) 10 MG tablet Take 10 mg by mouth 3 (three) times a week. Monday, Wednesday, Fridays   Yes Historical Provider, MD  acetaminophen (TYLENOL) 325 MG tablet Take 2 tablets (650 mg total) by mouth every 6 (six) hours as needed (or Fever >/= 101). 01/21/12 01/20/13  Alexzandrew Perkins, PA  docusate sodium 100 MG CAPS Take 100 mg by mouth 2 (two) times daily. 01/21/12 01/31/12  Alexzandrew Julien Girt, PA  HYDROcodone-acetaminophen (NORCO) 5-325 MG per tablet Take 1-2 tablets by mouth every 4 (four) hours as needed.  01/21/12 01/31/12  Alexzandrew Perkins, PA  methocarbamol (ROBAXIN) 500 MG tablet Take 1 tablet (500 mg total) by mouth every 6 (six) hours as needed. 01/21/12 01/31/12  Alexzandrew Perkins, PA  ondansetron (ZOFRAN) 4 MG tablet Take 1 tablet (4 mg total) by mouth every 6 (six) hours as needed for nausea. 01/21/12 01/28/12  Alexzandrew Julien Girt, PA  rivaroxaban (XARELTO) 10 MG TABS tablet Take 1 tablet (10 mg total) by mouth daily with breakfast. Take for three weeks and then discontinue.  May resume 81 mg Baby Aspirin once Xarelto is completed. 01/21/12   Alexzandrew Julien Girt, PA    Diet: heart healthy  Activity:WBAT  Follow-up:in 2 weeks  Disposition: [facility] Discharged Condition: pending at the time of this summary, transfer if doing well on Saturday 01/22/2012   Discharge Orders    Future Orders Please Complete By Expires   Diet - low sodium heart healthy      Call MD / Call 911      Comments:   If you experience chest pain or shortness of breath, CALL 911 and be transported to the hospital emergency room.  If you develope a fever above 101 F, pus (white drainage) or increased drainage or redness at the wound, or calf pain, call your surgeon's office.   Constipation Prevention      Comments:   Drink plenty of fluids.  Prune juice may be helpful.  You may use a stool softener, such as Colace (over the counter) 100 mg twice a day.  Use MiraLax (over the counter) for constipation as needed.   Increase activity slowly as tolerated      Weight Bearing as taught in Physical Therapy      Comments:   Use a walker or crutches as instructed.   Discharge instructions      Comments:   Pick up stool softner and laxative for home. Do not submerge incision under water. May shower. Continue to use ice for pain and swelling from surgery.    Driving restrictions      Comments:   No driving   Lifting restrictions      Comments:   No lifting   TED hose      Comments:   Use stockings (TED hose) for  3 weeks on both leg(s).  You may remove them at night for sleeping.   Change dressing      Comments:   Change dressing daily with sterile 4 x 4 inch gauze dressing and apply TED hose.   Do not put a pillow under the knee. Place it under the heel.        Medication  List  As of 01/21/2012  2:11 PM   STOP taking these medications         aspirin EC 81 MG tablet      Biotin 1000 MCG tablet      calcium citrate-vitamin D 315-200 MG-UNIT per tablet      glucosamine-chondroitin 500-400 MG tablet      mulitivitamin with minerals Tabs         TAKE these medications         acetaminophen 325 MG tablet   Commonly known as: TYLENOL   Take 2 tablets (650 mg total) by mouth every 6 (six) hours as needed (or Fever >/= 101).      benazepril 20 MG tablet   Commonly known as: LOTENSIN   Take 20 mg by mouth 2 (two) times daily.      DSS 100 MG Caps   Take 100 mg by mouth 2 (two) times daily.      HYDROcodone-acetaminophen 5-325 MG per tablet   Commonly known as: NORCO   Take 1-2 tablets by mouth every 4 (four) hours as needed.      indapamide 2.5 MG tablet   Commonly known as: LOZOL   Take 2.5 mg by mouth daily before breakfast.      methocarbamol 500 MG tablet   Commonly known as: ROBAXIN   Take 1 tablet (500 mg total) by mouth every 6 (six) hours as needed.      ondansetron 4 MG tablet   Commonly known as: ZOFRAN   Take 1 tablet (4 mg total) by mouth every 6 (six) hours as needed for nausea.      rivaroxaban 10 MG Tabs tablet   Commonly known as: XARELTO   Take 1 tablet (10 mg total) by mouth daily with breakfast. Take for three weeks and then discontinue.  May resume 81 mg Baby Aspirin once Xarelto is completed.      rosuvastatin 10 MG tablet   Commonly known as: CRESTOR   Take 10 mg by mouth 3 (three) times a week. Monday, Wednesday, Fridays           Follow-up Information    Follow up with Loanne Drilling, MD. Schedule an appointment as soon as possible for a visit in  2 weeks. (Please have [facility] help patient make arrangements for follow up and transportation.)    Contact information:   [facility] [address], [address] [place] [address] [phone]          Signed: Patrica Duel 01/21/2012, 2:11 PM  The patient did well with therapy yesterday and is medically ready for discharge to SNF today. Continue plan as outlined above. Wound looks fine. Patient may shower daily.

## 2012-01-21 NOTE — Evaluation (Addendum)
Occupational Therapy Evaluation Patient Details Name: JOLIENE SALVADOR MRN: [id number] DOB: 11-05-1932 Today's Date: 01/21/2012 Time in: 8:43 am Time out: 9:24 am Eval II   Problem List: There is no problem list on file for this patient.   Past Medical History:  Past Medical History  Diagnosis Date  . Hypertension   . Elevated cholesterol   . Hepatitis 1979    HEPATITIS A WITH JAUNDICE  . Varicose veins   . Complication of anesthesia     SLOW TO AWAKE AFTER 2008 SURGERY, PREFERS SPINAL IF POSSIBLE   Past Surgical History:  Past Surgical History  Procedure Date  . Dilation and curettage of uterus YRS AGO    X 2  . Right rotator cuff repair 2005  . Right total knee replacment 2008  . Total knee arthroplasty 01/19/2012    Procedure: TOTAL KNEE ARTHROPLASTY;  Surgeon: Loanne Drilling, MD;  Location: [facility];  Service: Orthopedics;  Laterality: Left;    OT Assessment/Plan/Recommendation OT Assessment Clinical Impression Statement: Pt will benefit from skilled OT services to increase her independence with ADL for next venue of care. OT Recommendation/Assessment: Patient will need skilled OT in the acute care venue OT Problem List: Decreased strength;Decreased knowledge of use of DME or AE;Pain;Decreased activity tolerance OT Therapy Diagnosis : Generalized weakness OT Plan OT Frequency: Min 1X/week OT Treatment/Interventions: Self-care/ADL training;Therapeutic activities;Patient/family education;DME and/or AE instruction OT Recommendation Follow Up Recommendations: Skilled nursing facility Equipment Recommended: Defer to next venue Individuals Consulted Consulted and Agree with Results and Recommendations: Patient OT Goals Acute Rehab OT Goals OT Goal Formulation: With patient Time For Goal Achievement: 7 days ADL Goals Pt Will Perform Grooming: with supervision;Standing at sink ADL Goal: Grooming - Progress: Goal set today Pt Will Perform Lower Body Bathing: with min  assist;Sit to stand from chair;Sit to stand from bed ADL Goal: Lower Body Bathing - Progress: Goal set today Pt Will Perform Lower Body Dressing: with min assist;Sit to stand from bed;Sit to stand from chair ADL Goal: Lower Body Dressing - Progress: Goal set today Pt Will Transfer to Toilet: with supervision;with DME;Ambulation;3-in-1 ADL Goal: Toilet Transfer - Progress: Goal set today Pt Will Perform Toileting - Clothing Manipulation: with supervision;Standing ADL Goal: Toileting - Clothing Manipulation - Progress: Goal set today Pt Will Perform Toileting - Hygiene: with supervision;Sit to stand from 3-in-1/toilet ADL Goal: Toileting - Hygiene - Progress: Goal set today  OT Evaluation Precautions/Restrictions  Precautions Precautions: Knee Required Braces or Orthoses: Yes Knee Immobilizer: Discontinue once straight leg raise with < 10 degree lag Restrictions Weight Bearing Restrictions: No Prior Functioning Home Living Lives With: Spouse Receives Help From: Family Type of Home: House (pt plans snf ) Prior Function Level of Independence: Independent with basic ADLs;Independent with homemaking with ambulation ADL ADL Eating/Feeding: Simulated;Independent Where Assessed - Eating/Feeding: Bed level Grooming: Simulated;Set up Where Assessed - Grooming: Sitting, chair Upper Body Bathing: Simulated;Chest;Right arm;Left arm;Abdomen;Set up Where Assessed - Upper Body Bathing: Unsupported;Sitting, bed Lower Body Bathing: Moderate assistance;Simulated Where Assessed - Lower Body Bathing: Sit to stand from bed Upper Body Dressing: Simulated;Set up Where Assessed - Upper Body Dressing: Sitting, bed;Unsupported Lower Body Dressing: Simulated;Moderate assistance Where Assessed - Lower Body Dressing: Sit to stand from bed Toilet Transfer: Performed;Minimal assistance Toilet Transfer Details (indicate cue type and reason): takes very small steps, difficulty accepting weight on L LE Toilet  Transfer Method: Ambulating Toilet Transfer Equipment: Raised toilet seat with arms (or 3-in-1 over toilet);Other (comment) (RW) Toileting - Clothing Manipulation: Simulated;Minimal  assistance Where Assessed - Toileting Clothing Manipulation: Standing Toileting - Hygiene: Simulated;Minimal assistance Where Assessed - Toileting Hygiene: Standing Tub/Shower Transfer: Not assessed Tub/Shower Transfer Method: Not assessed Equipment Used: Rolling walker ADL Comments: Pt became alittle light headed after sitting commode and also became alittle nauseous. BP 163/82 sitting on commode. Nausea and lightheadedness improved once back in the recliner. Recliner had to be pulled up to bathroom doorway due to lightheadedness.  Vision/Perception    Cognition Cognition Arousal/Alertness: Awake/alert Overall Cognitive Status: Appears within functional limits for tasks assessed Orientation Level: Oriented X4 Sensation/Coordination Sensation Light Touch: Appears Intact Extremity Assessment RUE Assessment RUE Assessment: Within Functional Limits LUE Assessment LUE Assessment: Within Functional Limits Mobility  Bed Mobility Supine to Sit: 4: Min assist;HOB elevated (Comment degrees);With rails Transfers Sit to Stand: 4: Min assist;From elevated surface;With upper extremity assist;From bed;From chair/3-in-1 Stand to Sit: 4: Min assist;With upper extremity assist;To elevated surface;To chair/3-in-1 Exercises   End of Session OT - End of Session Equipment Utilized During Treatment: Gait belt;Other (comment) (RW) Activity Tolerance: Other (comment) (lightheadedness and nausea) Patient left: in chair;with call bell in reach General Behavior During Session: [facility] for tasks performed Cognition: [facility] for tasks performed   Lennox Laity [phone] 01/21/2012, 11:20 AM

## 2012-01-21 NOTE — Progress Notes (Signed)
[facility] has SNf bed available for this pt in the am. CSW will assist with d/c planning to SNF.

## 2012-01-21 NOTE — Progress Notes (Signed)
Physical Therapy Treatment Patient Details Name: [name] MRN: [id number] DOB: 1932/12/05 Today's Date: 01/21/2012 1112-1137e g PT Assessment/Plan  PT - Assessment/Plan Comments on Treatment Session: pt with better pain control and not dizzy PT Plan: Discharge plan remains appropriate PT Frequency: 7X/week Recommendations for Other Services: OT consult Follow Up Recommendations: Skilled nursing facility Equipment Recommended: None recommended by PT PT Goals  Acute Rehab PT Goals PT Goal Formulation: With patient Time For Goal Achievement: 7 days Pt will go Supine/Side to Sit: with supervision Pt will go Sit to Supine/Side: with supervision Pt will go Sit to Stand: with supervision PT Goal: Sit to Stand - Progress: Progressing toward goal Pt will go Stand to Sit: with supervision PT Goal: Stand to Sit - Progress: Progressing toward goal Pt will Ambulate: 51 - 150 feet;with supervision;with rolling walker PT Goal: Ambulate - Progress: Progressing toward goal Pt will Perform Home Exercise Program: with supervision, verbal cues required/provided PT Goal: Perform Home Exercise Program - Progress: Progressing toward goal  PT Treatment Precautions/Restrictions  Precautions Precautions: Knee Required Braces or Orthoses: Yes Knee Immobilizer: Discontinue once straight leg raise with < 10 degree lag Restrictions Weight Bearing Restrictions: No Mobility (including Balance) Bed Mobility Bed Mobility: No Transfers Transfers: Yes Sit to Stand: 4: Min assist;With upper extremity assist;From chair/3-in-1 Stand to Sit: 4: Min assist;With armrests;To chair/3-in-1 Stand to Sit Details: support LLE as pt sat, vc for hand placement Ambulation/Gait Ambulation/Gait: Yes Ambulation/Gait Assistance: 4: Min assist Ambulation/Gait Assistance Details (indicate cue type and reason): vc to try longer L step Ambulation Distance (Feet): 40 Feet Assistive device: Rolling walker Gait Pattern:  Step-to pattern;Decreased stance time - left    Exercise  Total Joint Exercises Ankle Circles/Pumps: AROM;Left;10 reps;Supine Quad Sets: AROM;Left;10 reps;Supine Heel Slides: AAROM;10 reps;Supine;Left Straight Leg Raises: AAROM;Left;10 reps;Seated End of Session PT - End of Session Equipment Utilized During Treatment: Left knee immobilizer Activity Tolerance: Patient tolerated treatment well (nausea) Patient left: with call bell in reach;in chair Nurse Communication: Mobility status for transfers General Behavior During Session: [facility] for tasks performed Cognition: [facility] for tasks performed  Rada Hay 01/21/2012, 3:37 PM

## 2012-01-22 LAB — CBC
HCT: 30.3 % — ABNORMAL LOW (ref 36.0–46.0)
Hemoglobin: 10 g/dL — ABNORMAL LOW (ref 12.0–15.0)
MCHC: 33 g/dL (ref 30.0–36.0)
RBC: 3.27 MIL/uL — ABNORMAL LOW (ref 3.87–5.11)

## 2012-01-22 NOTE — Progress Notes (Signed)
Subjective: 3 Days Post-Op Procedure(s) (LRB): TOTAL KNEE ARTHROPLASTY (Left) Patient reports pain as mild.   Patient has no  complaints  Ready to go to SNF today  Objective: Vital signs in last 24 hours: Temp:  [97.8 F (36.6 C)-99.4 F (37.4 C)] 98 F (36.7 C) (01/26 0539) Pulse Rate:  [93-96] 96  (01/26 0539) Resp:  [16] 16  (01/26 0539) BP: (118-163)/(72-84) 127/84 mmHg (01/26 0539) SpO2:  [91 %-97 %] 93 % (01/26 0539)  Intake/Output from previous day:  Intake/Output Summary (Last 24 hours) at 01/22/12 0653 Last data filed at 01/22/12 0405  Gross per 24 hour  Intake   1220 ml  Output   1300 ml  Net    -80 ml    Intake/Output this shift: Total I/O In: -  Out: 500 [Urine:500]  Labs:  [facility] 01/21/12 0355 01/20/12 0410  HGB 10.7* 10.8*    Basename 01/21/12 0355 01/20/12 0410  WBC 7.6 5.5  RBC 3.38* 3.46*  HCT 31.7* 32.4*  PLT 128* 130*    Basename 01/21/12 0355 01/20/12 0410  NA 137 139  K 3.6 4.1  CL 102 104  CO2 26 28  BUN 11 16  CREATININE 0.99 1.01  GLUCOSE 147* 128*  CALCIUM 8.9 8.8   No results found for this basename: LABPT:2,INR:2 in the last 72 hours  Exam - Neurologically intact Neurovascular intact Incision: dressing C/D/I No cellulitis present Compartment soft Dressing/Incision - clean, dry, no drainage Motor function intact - moving foot and toes well on exam.   Past Medical History  Diagnosis Date  . Hypertension   . Elevated cholesterol   . Hepatitis 1979    HEPATITIS A WITH JAUNDICE  . Varicose veins   . Complication of anesthesia     SLOW TO AWAKE AFTER 2008 SURGERY, PREFERS SPINAL IF POSSIBLE    Assessment/Plan: 3 Days Post-Op Procedure(s) (LRB): TOTAL KNEE ARTHROPLASTY (Left) Active Problems:  * No active hospital problems. *    Up with therapy Discharge to SNF F/U in office in 2 weeks  DVT Prophylaxis - Xarelto  Protocol Weight-Bearing as tolerated to left leg  Hernandez Losasso V 01/22/2012, 6:53  AM

## 2012-01-22 NOTE — Progress Notes (Signed)
Physical Therapy Treatment Patient Details Name: [name] MRN: [id number] DOB: [birth year] Today's Date: 01/22/2012 7829-5621 eg PT Assessment/Plan  PT - Assessment/Plan Comments on Treatment Session: pt olerated very well PT Plan: Discharge plan remains appropriate PT Goals  Acute Rehab PT Goals Pt will go Supine/Side to Sit: with supervision PT Goal: Supine/Side to Sit - Progress: Progressing toward goal Pt will go Sit to Stand: with supervision PT Goal: Sit to Stand - Progress: Progressing toward goal Pt will go Stand to Sit: with supervision PT Goal: Stand to Sit - Progress: Progressing toward goal Pt will Ambulate: 51 - 150 feet;with supervision;with rolling walker PT Goal: Ambulate - Progress: Progressing toward goal Pt will Perform Home Exercise Program: with supervision, verbal cues required/provided PT Goal: Perform Home Exercise Program - Progress: Progressing toward goal  PT Treatment Precautions/Restrictions  Precautions Precautions: Knee Required Braces or Orthoses: Yes Knee Immobilizer: Discontinue once straight leg raise with < 10 degree lag Restrictions Weight Bearing Restrictions: No Mobility (including Balance) Bed Mobility Supine to Sit: 4: Min assist Transfers Sit to Stand: 4: Min assist;From bed;From chair/3-in-1 Sit to Stand Details (indicate cue type and reason): vc to push from bed Stand to Sit: 5: Supervision;To chair/3-in-1;With upper extremity assist Ambulation/Gait Ambulation/Gait Assistance: 4: Min assist Ambulation/Gait Assistance Details (indicate cue type and reason): improved sequence and step length Ambulation Distance (Feet): 60 Feet Assistive device: Rolling walker Gait Pattern: Step-to pattern    Exercise  Total Joint Exercises Ankle Circles/Pumps: AROM;Left;10 reps;Supine Quad Sets: AROM;Left;10 reps;Supine Short Arc Quad: AAROM;Left;10 reps Heel Slides: AAROM;10 reps;Supine;Left Hip ABduction/ADduction: AAROM;Left;10  reps;Supine Straight Leg Raises: AAROM;Left;10 reps;Seated End of Session PT - End of Session Equipment Utilized During Treatment: Left knee immobilizer Activity Tolerance: Patient tolerated treatment well Patient left: in chair;with call bell in reach  [place] 01/22/2012, 4:48 PM

## 2012-01-22 NOTE — Progress Notes (Signed)
Patient for d/c today to SNF bed [facility]. Patient and husband agreeable to this plan- plan transfer via EMS. Reece Levy, MSW, Theresia Majors [phone]

## 2012-01-25 NOTE — Progress Notes (Signed)
  CARE MANAGEMENT NOTE 01/25/2012  Patient:  [name], [name]   Account Number:  [id number]  Date Initiated:  01/22/2012  Documentation initiated by:  Colleen Can  Subjective/Objective Assessment:   DX osetarthritis Left knee; Total knee replacemnt     Action/Plan:   ST SNF   Anticipated DC Date:  01/22/2012   Anticipated DC Plan:  SKILLED NURSING FACILITY  In-house referral  Clinical Social Worker      DC Planning Services  NA      [facility] Choice  NA   Choice offered to / List presented to:  NA   DME arranged  NA      DME agency  NA     HH arranged  NA      HH agency  NA   Status of service:  Completed, signed off Medicare Important Message given?  NA - LOS <3 / Initial given by admissions (If response is "NO", the following Medicare IM given date fields will be blank) Date Medicare IM given:   Date Additional Medicare IM given:    Discharge Disposition:  SKILLED NURSING FACILITY  Per UR Regulation:    Comments:

## 2012-04-06 ENCOUNTER — Other Ambulatory Visit: Payer: Self-pay | Admitting: Obstetrics and Gynecology

## 2012-04-06 DIAGNOSIS — Z1231 Encounter for screening mammogram for malignant neoplasm of breast: Secondary | ICD-10-CM

## 2012-05-10 ENCOUNTER — Encounter (HOSPITAL_COMMUNITY): Payer: Self-pay | Admitting: *Deleted

## 2012-05-10 ENCOUNTER — Ambulatory Visit (HOSPITAL_COMMUNITY)
Admission: RE | Admit: 2012-05-10 | Discharge: 2012-05-10 | Disposition: A | Discharge status: Home / Self Care | Payer: Medicare Other | Source: Ambulatory Visit | Attending: Obstetrics and Gynecology | Admitting: Obstetrics and Gynecology

## 2012-05-10 DIAGNOSIS — Z1231 Encounter for screening mammogram for malignant neoplasm of breast: Secondary | ICD-10-CM | POA: Insufficient documentation

## 2012-05-11 ENCOUNTER — Other Ambulatory Visit: Payer: Self-pay | Admitting: Orthopedic Surgery

## 2012-05-11 NOTE — Progress Notes (Signed)
Preoperative surgical orders have been place into the Epic hospital system for [name] on 05/11/2012, 5:29 PM  by Patrica Duel for surgery on 05/15/2012.  Preop  Knee orders including  IV Tylenol, and IV Decadron as long as there are no contraindications to the above medications.

## 2012-05-15 ENCOUNTER — Ambulatory Visit (HOSPITAL_COMMUNITY)
Admission: RE | Admit: 2012-05-15 | Discharge: 2012-05-15 | Disposition: A | Discharge status: Home / Self Care | Payer: Medicare Other | Source: Ambulatory Visit | Attending: Orthopedic Surgery | Admitting: Orthopedic Surgery

## 2012-05-15 ENCOUNTER — Encounter (HOSPITAL_COMMUNITY): Payer: Self-pay | Admitting: *Deleted

## 2012-05-15 ENCOUNTER — Ambulatory Visit (HOSPITAL_COMMUNITY): Payer: Medicare Other | Admitting: Anesthesiology

## 2012-05-15 ENCOUNTER — Encounter (HOSPITAL_COMMUNITY)
Admission: RE | Disposition: A | Discharge status: Home / Self Care | Payer: Self-pay | Source: Ambulatory Visit | Attending: Orthopedic Surgery

## 2012-05-15 ENCOUNTER — Encounter (HOSPITAL_COMMUNITY): Payer: Self-pay | Admitting: Anesthesiology

## 2012-05-15 DIAGNOSIS — M24669 Ankylosis, unspecified knee: Secondary | ICD-10-CM | POA: Insufficient documentation

## 2012-05-15 DIAGNOSIS — I10 Essential (primary) hypertension: Secondary | ICD-10-CM | POA: Insufficient documentation

## 2012-05-15 DIAGNOSIS — Z79899 Other long term (current) drug therapy: Secondary | ICD-10-CM | POA: Insufficient documentation

## 2012-05-15 DIAGNOSIS — M25669 Stiffness of unspecified knee, not elsewhere classified: Secondary | ICD-10-CM

## 2012-05-15 HISTORY — PX: KNEE CLOSED REDUCTION: SHX995

## 2012-05-15 HISTORY — DX: Unspecified osteoarthritis, unspecified site: M19.90

## 2012-05-15 LAB — BASIC METABOLIC PANEL
CO2: 28 mEq/L (ref 19–32)
Calcium: 9.7 mg/dL (ref 8.4–10.5)
Chloride: 103 mEq/L (ref 96–112)
Creatinine, Ser: 1.08 mg/dL (ref 0.50–1.10)
Glucose, Bld: 84 mg/dL (ref 70–99)

## 2012-05-15 LAB — CBC
HCT: 41.1 % (ref 36.0–46.0)
Hemoglobin: 13.4 g/dL (ref 12.0–15.0)
MCH: 28.9 pg (ref 26.0–34.0)
MCV: 88.8 fL (ref 78.0–100.0)
RBC: 4.63 MIL/uL (ref 3.87–5.11)
WBC: 6.2 10*3/uL (ref 4.0–10.5)

## 2012-05-15 SURGERY — MANIPULATION, KNEE, CLOSED
Anesthesia: General | Site: Knee | Laterality: Left

## 2012-05-15 MED ORDER — DEXAMETHASONE SODIUM PHOSPHATE 10 MG/ML IJ SOLN
10.0000 mg | Freq: Once | INTRAMUSCULAR | Status: DC
  Filled 2012-05-15: qty 1

## 2012-05-15 MED ORDER — FENTANYL CITRATE 0.05 MG/ML IJ SOLN
INTRAMUSCULAR | Status: AC
  Filled 2012-05-15: qty 2

## 2012-05-15 MED ORDER — ONDANSETRON HCL 4 MG/2ML IJ SOLN: 4.0000 mg | Freq: Four times a day (QID) | INTRAMUSCULAR | Status: DC | PRN

## 2012-05-15 MED ORDER — SODIUM CHLORIDE 0.9 % IJ SOLN: 3.0000 mL | INTRAMUSCULAR | Status: DC | PRN

## 2012-05-15 MED ORDER — CEFAZOLIN SODIUM-DEXTROSE 2-3 GM-% IV SOLR: 2.0000 g | INTRAVENOUS | Status: DC

## 2012-05-15 MED ORDER — HYDROCODONE-ACETAMINOPHEN 5-325 MG PO TABS
1.0000 | ORAL_TABLET | ORAL | Status: DC | PRN
  Administered 2012-05-15: 1 via ORAL

## 2012-05-15 MED ORDER — LACTATED RINGERS IV SOLN: INTRAVENOUS | Status: DC

## 2012-05-15 MED ORDER — LACTATED RINGERS IV SOLN
INTRAVENOUS | Status: DC
  Administered 2012-05-15: 1000 mL via INTRAVENOUS

## 2012-05-15 MED ORDER — ACETAMINOPHEN 325 MG PO TABS: 650.0000 mg | ORAL_TABLET | ORAL | Status: DC | PRN

## 2012-05-15 MED ORDER — ACETAMINOPHEN 650 MG RE SUPP
650.0000 mg | RECTAL | Status: DC | PRN
  Filled 2012-05-15: qty 1

## 2012-05-15 MED ORDER — FENTANYL CITRATE 0.05 MG/ML IJ SOLN
25.0000 ug | INTRAMUSCULAR | Status: DC | PRN
  Administered 2012-05-15 (×2): 25 ug via INTRAVENOUS

## 2012-05-15 MED ORDER — LIDOCAINE HCL (CARDIAC) 20 MG/ML IV SOLN
INTRAVENOUS | Status: DC | PRN
  Administered 2012-05-15: 50 mg via INTRAVENOUS

## 2012-05-15 MED ORDER — FENTANYL CITRATE 0.05 MG/ML IJ SOLN
INTRAMUSCULAR | Status: DC | PRN
  Administered 2012-05-15: 100 ug via INTRAVENOUS

## 2012-05-15 MED ORDER — SODIUM CHLORIDE 0.9 % IJ SOLN: 3.0000 mL | Freq: Two times a day (BID) | INTRAMUSCULAR | Status: DC

## 2012-05-15 MED ORDER — SODIUM CHLORIDE 0.9 % IV SOLN: INTRAVENOUS | Status: DC

## 2012-05-15 MED ORDER — PROPOFOL 10 MG/ML IV BOLUS
INTRAVENOUS | Status: DC | PRN
  Administered 2012-05-15: 175 mg via INTRAVENOUS

## 2012-05-15 MED ORDER — SODIUM CHLORIDE 0.9 % IV SOLN: 250.0000 mL | INTRAVENOUS | Status: DC | PRN

## 2012-05-15 MED ORDER — HYDROCODONE-ACETAMINOPHEN 5-325 MG PO TABS
ORAL_TABLET | ORAL | Status: AC
  Filled 2012-05-15: qty 1

## 2012-05-15 MED ORDER — MUPIROCIN 2 % EX OINT
TOPICAL_OINTMENT | CUTANEOUS | Status: AC
  Filled 2012-05-15: qty 22

## 2012-05-15 MED ORDER — ACETAMINOPHEN 10 MG/ML IV SOLN
1000.0000 mg | Freq: Once | INTRAVENOUS | Status: DC
  Filled 2012-05-15: qty 100

## 2012-05-15 MED ORDER — PROMETHAZINE HCL 25 MG/ML IJ SOLN: 6.2500 mg | INTRAMUSCULAR | Status: DC | PRN

## 2012-05-15 MED ORDER — CHLORHEXIDINE GLUCONATE 4 % EX LIQD
60.0000 mL | Freq: Once | CUTANEOUS | Status: DC
  Filled 2012-05-15: qty 60

## 2012-05-15 NOTE — Transfer of Care (Signed)
Immediate Anesthesia Transfer of Care Note  Patient: [name]  Procedure(s) Performed: Procedure(s) (LRB): CLOSED MANIPULATION KNEE (Left)  Patient Location: PACU  Anesthesia Type: General  Level of Consciousness: awake, alert , oriented and patient cooperative  Airway & Oxygen Therapy: Patient Spontanous Breathing and Patient connected to face mask oxygen  Post-op Assessment: Report given to PACU RN and Post -op Vital signs reviewed and stable  Post vital signs: Reviewed and stable  Complications: No apparent anesthesia complications

## 2012-05-15 NOTE — Interval H&P Note (Signed)
History and Physical Interval Note:  05/15/2012 4:32 PM  [name]  has presented today for surgery, with the diagnosis of arthrofibrosis left knee   The various methods of treatment have been discussed with the patient and family. After consideration of risks, benefits and other options for treatment, the patient has consented to  Procedure(s) (LRB): CLOSED MANIPULATION KNEE (Left) as a surgical intervention .  The patients' history has been reviewed, patient examined, no change in status, stable for surgery.  I have reviewed the patients' chart and labs.  Questions were answered to the patient's satisfaction.     Loanne Drilling

## 2012-05-15 NOTE — Progress Notes (Signed)
Patient informed nasal swab positive for staph. Patient instructed to use mupirocin ointment for totall of ten doses. Patient verbalized understanding of instructions

## 2012-05-15 NOTE — Op Note (Signed)
  OPERATIVE REPORT   PREOPERATIVE DIAGNOSIS: Arthrofibrosis, Left  knee.   POSTOPERATIVE DIAGNOSIS: Arthrofibrosis, Left knee.   PROCEDURE:  Left  knee closed manipulation.   SURGEON: Ollen Gross, MD   ASSISTANT: None.   ANESTHESIA: General.   COMPLICATIONS: None.   CONDITION: Stable to Recovery.   Pre-manipulation range of motion is 5-95.  Post-manipulation range of  Motion is 0-130  PROCEDURE IN DETAIL: After successful administration of general  anesthetic, exam under anesthesia was performed showing range of motion  5-95 degrees. I then placed my chest against the proximal tibia,  flexing the knee with audible lysis of adhesions. I was easily able to  get the knee flexed to 130  degrees. I then put the knee back in extension and with some  patellar manipulation and gentle pressure got to full  Extension.The patient was subsequently awakened and transported to Recovery in  stable condition. Gus Rankin Niquita Digioia, MD    05/15/2012, 4:47 PM

## 2012-05-15 NOTE — Anesthesia Postprocedure Evaluation (Signed)
Anesthesia Post Note  Patient: [name]  Procedure(s) Performed: Procedure(s) (LRB): CLOSED MANIPULATION KNEE (Left)  Anesthesia type: General  Patient location: PACU  Post pain: Pain level controlled  Post assessment: Post-op Vital signs reviewed  Last Vitals:  Filed Vitals:   05/15/12 1431  BP: 139/79  Pulse: 92  Temp: 36.4 C  Resp: 20    Post vital signs: Reviewed  Level of consciousness: sedated  Complications: No apparent anesthesia complications

## 2012-05-15 NOTE — Anesthesia Preprocedure Evaluation (Signed)
Anesthesia Evaluation  Patient identified by MRN, date of birth, ID band Patient awake    Reviewed: Allergy & Precautions, H&P , NPO status , Patient's Chart, lab work & pertinent test results  History of Anesthesia Complications (+) PROLONGED EMERGENCE  Airway Mallampati: II TM Distance: >3 FB Neck ROM: Full    Dental No notable dental hx.  Multiple caps covering all teeth:   Pulmonary neg pulmonary ROS,  breath sounds clear to auscultation  Pulmonary exam normal       Cardiovascular hypertension, Pt. on medications Rhythm:Regular Rate:Normal     Neuro/Psych negative neurological ROS  negative psych ROS   GI/Hepatic negative GI ROS, (+) Hepatitis -, A  Endo/Other  negative endocrine ROS  Renal/GU negative Renal ROS  negative genitourinary   Musculoskeletal negative musculoskeletal ROS (+)   Abdominal   Peds negative pediatric ROS (+)  Hematology negative hematology ROS (+)   Anesthesia Other Findings   Reproductive/Obstetrics negative OB ROS                           Anesthesia Physical Anesthesia Plan  ASA: II  Anesthesia Plan: General   Post-op Pain Management:    Induction: Intravenous  Airway Management Planned: Mask  Additional Equipment:   Intra-op Plan:   Post-operative Plan:   Informed Consent: I have reviewed the patients History and Physical, chart, labs and discussed the procedure including the risks, benefits and alternatives for the proposed anesthesia with the patient or authorized representative who has indicated his/her understanding and acceptance.   Dental advisory given  Plan Discussed with: CRNA  Anesthesia Plan Comments:         Anesthesia Quick Evaluation

## 2012-05-15 NOTE — H&P (Signed)
  CC- [name] is a 78 y.o. female who presents with left knee stiffness.  HPI- . Knee Pain: Patient presents with stiffness involving the  left knee. Onset of the symptoms was several months ago. Inciting event: Left total knee arthroplasty. Current symptoms include stiffness despite physical therapy. She has been stuck at less than 100 degrees flexion for over a month now and presents for closed manipulation.  Past Medical History  Diagnosis Date  . Hypertension   . Elevated cholesterol   . Hepatitis 1979    HEPATITIS A WITH JAUNDICE  . Varicose veins   . Complication of anesthesia     SLOW TO AWAKE AFTER 2008 SURGERY, PREFERS SPINAL IF POSSIBLE  . Arthritis     osteoarthritis    Past Surgical History  Procedure Date  . Dilation and curettage of uterus YRS AGO    X 2  . Right rotator cuff repair 2005  . Right total knee replacment 2008  . Total knee arthroplasty 01/19/2012    Procedure: TOTAL KNEE ARTHROPLASTY;  Surgeon: Loanne Drilling, MD;  Location: [facility];  Service: Orthopedics;  Laterality: Left;  . Joint replacement 12/2011    left knee  . Joint replacement 2008    right knee  . Tonsillectomy     at 18 yrs. old    Prior to Admission medications   Medication Sig Start Date End Date Taking? Authorizing Provider  acetaminophen (TYLENOL) 325 MG tablet Take 2 tablets (650 mg total) by mouth every 6 (six) hours as needed (or Fever >/= 101). 01/21/12 01/20/13 Yes Alexzandrew Julien Girt, PA  benazepril (LOTENSIN) 20 MG tablet Take 20 mg by mouth 2 (two) times daily.   Yes Historical Provider, MD  Calcium Carbonate-Vit D-Min (CALTRATE 600+D PLUS) 600-400 MG-UNIT per tablet Take 1 tablet by mouth daily.   Yes Historical Provider, MD  glucosamine-chondroitin 500-400 MG tablet Take 1 tablet by mouth 3 (three) times daily.   Yes Historical Provider, MD  indapamide (LOZOL) 2.5 MG tablet Take 2.5 mg by mouth daily before breakfast.   Yes Historical Provider, MD  Multiple Vitamin  (MULITIVITAMIN WITH MINERALS) TABS Take 1 tablet by mouth daily.   Yes Historical Provider, MD  rosuvastatin (CRESTOR) 10 MG tablet Take 10 mg by mouth 3 (three) times a week. Monday, Wednesday, Fridays   Yes Historical Provider, MD    Range of motion left knee 5-95  Physical Examination: General appearance - alert, well appearing, and in no distress Mental status - alert, oriented to person, place, and time Chest - clear to auscultation, no wheezes, rales or rhonchi, symmetric air entry Heart - normal rate, regular rhythm, normal S1, S2, no murmurs, rubs, clicks or gallops Abdomen - soft, nontender, nondistended, no masses or organomegaly Neurological - alert, oriented, normal speech, no focal findings or movement disorder noted   Asessment/Plan--- Left knee arthrofibrosis - Plan left knee closed manipulation. Procedure risks and potential comps discussed with patient who elects to proceed. Goals are decreased pain and increased function with a high likelihood of achieving both

## 2012-05-16 ENCOUNTER — Encounter (HOSPITAL_COMMUNITY): Payer: Self-pay | Admitting: Orthopedic Surgery

## 2012-09-20 ENCOUNTER — Other Ambulatory Visit: Payer: Self-pay | Admitting: Dermatology

## 2013-04-03 ENCOUNTER — Other Ambulatory Visit: Payer: Self-pay | Admitting: Obstetrics and Gynecology

## 2013-04-03 DIAGNOSIS — Z1231 Encounter for screening mammogram for malignant neoplasm of breast: Secondary | ICD-10-CM

## 2013-05-15 ENCOUNTER — Ambulatory Visit (HOSPITAL_COMMUNITY)
Admission: RE | Admit: 2013-05-15 | Discharge: 2013-05-15 | Disposition: A | Discharge status: Home / Self Care | Payer: Medicare Other | Source: Ambulatory Visit | Attending: Obstetrics and Gynecology | Admitting: Obstetrics and Gynecology

## 2013-05-15 DIAGNOSIS — Z1231 Encounter for screening mammogram for malignant neoplasm of breast: Secondary | ICD-10-CM | POA: Insufficient documentation

## 2013-08-09 ENCOUNTER — Encounter: Payer: Self-pay | Admitting: Obstetrics and Gynecology

## 2013-08-10 ENCOUNTER — Ambulatory Visit (INDEPENDENT_AMBULATORY_CARE_PROVIDER_SITE_OTHER): Payer: Medicare Other | Admitting: Obstetrics and Gynecology

## 2013-08-10 ENCOUNTER — Encounter: Payer: Self-pay | Admitting: Obstetrics and Gynecology

## 2013-08-10 VITALS — BP 118/70 | HR 62 | Resp 16 | Ht 64.25 in | Wt 178.0 lb

## 2013-08-10 DIAGNOSIS — Z01419 Encounter for gynecological examination (general) (routine) without abnormal findings: Secondary | ICD-10-CM

## 2013-08-10 NOTE — Progress Notes (Addendum)
79 y.o.   Married    Caucasian   female   G3P3   here for annual exam.  No vag bleeding.  Had rectal bleeding two weeks ago of bright red blood.  Never reoccured.  Does has a known hemorrhoid.    No LMP recorded. Patient is postmenopausal.          Sexually active: no  The current method of family planning is status post hysterectomy.    Exercising: Advanced Chair Exercises, Weights 3 times a week Last mammogram:  04/2013 normal Last pap smear:07/14/10 neg History of abnormal pap: no Smoking: never Alcohol: 1-5 glasses of wine a week Last colonoscopy:2010 normal, repeat in 10 years Last Bone Density:  01/2012 Osteopenia Last tetanus shot:06/03/10 Last cholesterol check: 12/2012 normal with medication  Hgb:pcp                Urine:pcp   Family History  Problem Relation Age of Onset  . CVA Mother   . Heart failure Mother   . Heart failure Father     There are no active problems to display for this patient.   Past Medical History  Diagnosis Date  . Hypertension   . Elevated cholesterol   . Hepatitis 1979    HEPATITIS A WITH JAUNDICE  . Varicose veins   . Complication of anesthesia     SLOW TO AWAKE AFTER 2008 SURGERY, PREFERS SPINAL IF POSSIBLE  . Arthritis     osteoarthritis  . Diverticulosis 2001    Past Surgical History  Procedure Laterality Date  . Dilation and curettage of uterus  YRS AGO    X 2  . Right rotator cuff repair  2005  . Right total knee replacment  2008  . Total knee arthroplasty  01/19/2012    Procedure: TOTAL KNEE ARTHROPLASTY;  Surgeon: Loanne Drilling, MD;  Location: [facility];  Service: Orthopedics;  Laterality: Left;  . Joint replacement  12/2011    left knee  . Joint replacement  2008    right knee  . Tonsillectomy      at 18 yrs. old  . Knee closed reduction  05/15/2012    Procedure: CLOSED MANIPULATION KNEE;  Surgeon: Loanne Drilling, MD;  Location: [facility];  Service: Orthopedics;  Laterality: Left;  . Complex hyperplasic  2002    w/o atypia in  an  endocx polyp  . Hysteroscopy  12/2000    D&C    Allergies: Review of patient's allergies indicates no known allergies.  Current Outpatient Prescriptions  Medication Sig Dispense Refill  . benazepril (LOTENSIN) 20 MG tablet Take 10 mg by mouth daily.       Marland Kitchen BIOTIN PO Take 1,000 mg by mouth daily.      . Calcium Citrate-Vitamin D (CITRACAL + D PO) Take by mouth 4 (four) times daily.      Marland Kitchen glucosamine-chondroitin 500-400 MG tablet Take 1 tablet by mouth 2 (two) times daily.       . indapamide (LOZOL) 2.5 MG tablet Take 2.5 mg by mouth daily before breakfast.      . Multiple Vitamin (MULITIVITAMIN WITH MINERALS) TABS Take 1 tablet by mouth daily.      . rosuvastatin (CRESTOR) 10 MG tablet Take 10 mg by mouth 3 (three) times a week. Monday, Wednesday, Fridays      . traZODone (DESYREL) 50 MG tablet Take 50 mg by mouth at bedtime.       No current facility-administered medications for this visit.  trazadone helps her sleep  ROS: Pertinent items are noted in HPI.  Social ZO:XWRUEAV, three children, homemaker, lives at [place], husband has the beginning of Parkinson's  Exam:    BP 118/70  Pulse 62  Resp 16  Ht 5' 4.25" (1.632 m)  Wt 178 lb (80.74 kg)  BMI 30.31 kg/m2ht stable, wt up 4 pounds from last year   Wt Readings from Last 3 Encounters:  08/10/13 178 lb (80.74 kg)  05/15/12 175 lb 4 oz (79.493 kg)  05/15/12 175 lb 4 oz (79.493 kg)     Ht Readings from Last 3 Encounters:  08/10/13 5' 4.25" (1.632 m)  05/15/12 5' 4.5" (1.638 m)  05/15/12 5' 4.5" (1.638 m)    General appearance: alert, cooperative and appears stated age Head: Normocephalic, without obvious abnormality, atraumatic Neck: no adenopathy, supple, symmetrical, trachea midline and thyroid not enlarged, symmetric, no tenderness/mass/nodules Lungs: clear to auscultation bilaterally Breasts: Inspection negative, No nipple retraction or dimpling, No nipple discharge or bleeding, No axillary or supraclavicular  adenopathy, Normal to palpation without dominant masses Heart: regular rate and rhythm Abdomen: soft, non-tender; bowel sounds normal; no masses,  no organomegaly Extremities: extremities normal, atraumatic, no cyanosis or edema Skin: Skin color, texture, turgor normal. No rashes or lesions Lymph nodes: Cervical, supraclavicular, and axillary nodes normal. No abnormal inguinal nodes palpated Neurologic: Grossly normal   Pelvic: External genitalia:  no lesions              Urethra:  normal appearing urethra with no masses, tenderness or lesions              Bartholins and Skenes: normal                 Vagina: normal appearing vagina with normal color and discharge, no lesions              Cervix: normal appearance              Pap taken: no        Bimanual Exam:  Uterus:  uterus is normal size, shape, consistency and nontender                                      Adnexa: normal adnexa in size, nontender and no masses                                      Rectovaginal: Confirms                                      Anus:  normal sphincter tone, no lesions  A: normal menopausal exam, no HRT     H/o complex hyperplasia w/o atypia in 2002 in an endometrial polyp     bilat knee replacements     H/o hepatitis A in 1989     On drug holiday from fosamax per Dr Timothy Lasso since 2011     Mixed incontinence, unresponsive to anticholinergics (Enablex) and declined further tx     P:     mammogram counseled on breast self exam, mammography screening, adequate intake of calcium and vitamin D, diet and exercise return annually or prn With colonoscopy fairly recently being nl, and BRB per rectum x 1 only,  and known hemorrhoid, I will not recommend intervention at this point.  But if bleeding recurs, may need to consider evaluation then.  Discussed with pt who agrees to report any further rectal bleeding.     An After Visit Summary was printed and given to the patient.

## 2013-08-10 NOTE — Patient Instructions (Addendum)

## 2013-10-05 ENCOUNTER — Other Ambulatory Visit: Payer: Self-pay | Admitting: Dermatology

## 2014-04-16 ENCOUNTER — Other Ambulatory Visit (HOSPITAL_COMMUNITY): Payer: Self-pay | Admitting: Internal Medicine

## 2014-04-16 DIAGNOSIS — Z1231 Encounter for screening mammogram for malignant neoplasm of breast: Secondary | ICD-10-CM

## 2014-05-21 ENCOUNTER — Ambulatory Visit (HOSPITAL_COMMUNITY)
Admission: RE | Admit: 2014-05-21 | Discharge: 2014-05-21 | Disposition: A | Discharge status: Home / Self Care | Payer: Medicare Other | Source: Ambulatory Visit | Attending: Internal Medicine | Admitting: Internal Medicine

## 2014-05-21 DIAGNOSIS — Z1231 Encounter for screening mammogram for malignant neoplasm of breast: Secondary | ICD-10-CM | POA: Insufficient documentation

## 2014-05-28 ENCOUNTER — Encounter (HOSPITAL_COMMUNITY): Payer: Self-pay | Admitting: Pharmacy Technician

## 2014-05-29 ENCOUNTER — Other Ambulatory Visit: Payer: Self-pay | Admitting: Dermatology

## 2014-06-04 ENCOUNTER — Encounter (HOSPITAL_COMMUNITY): Payer: Self-pay | Admitting: *Deleted

## 2014-06-13 ENCOUNTER — Encounter (HOSPITAL_COMMUNITY): Payer: Self-pay | Admitting: *Deleted

## 2014-06-13 ENCOUNTER — Ambulatory Visit (HOSPITAL_COMMUNITY)
Admission: RE | Admit: 2014-06-13 | Discharge: 2014-06-13 | Disposition: A | Discharge status: Home / Self Care | Payer: Medicare Other | Source: Ambulatory Visit | Attending: Gastroenterology | Admitting: Gastroenterology

## 2014-06-13 ENCOUNTER — Encounter (HOSPITAL_COMMUNITY)
Admission: RE | Disposition: A | Discharge status: Home / Self Care | Payer: Self-pay | Source: Ambulatory Visit | Attending: Gastroenterology

## 2014-06-13 ENCOUNTER — Encounter (HOSPITAL_COMMUNITY): Payer: Medicare Other | Admitting: Anesthesiology

## 2014-06-13 ENCOUNTER — Ambulatory Visit (HOSPITAL_COMMUNITY): Payer: Medicare Other | Admitting: Anesthesiology

## 2014-06-13 DIAGNOSIS — M1990 Unspecified osteoarthritis, unspecified site: Secondary | ICD-10-CM | POA: Insufficient documentation

## 2014-06-13 DIAGNOSIS — K5730 Diverticulosis of large intestine without perforation or abscess without bleeding: Secondary | ICD-10-CM | POA: Insufficient documentation

## 2014-06-13 DIAGNOSIS — Z7982 Long term (current) use of aspirin: Secondary | ICD-10-CM | POA: Insufficient documentation

## 2014-06-13 DIAGNOSIS — Z860100 Personal history of colon polyps, unspecified: Secondary | ICD-10-CM | POA: Insufficient documentation

## 2014-06-13 DIAGNOSIS — Z79899 Other long term (current) drug therapy: Secondary | ICD-10-CM | POA: Insufficient documentation

## 2014-06-13 DIAGNOSIS — I10 Essential (primary) hypertension: Secondary | ICD-10-CM | POA: Insufficient documentation

## 2014-06-13 DIAGNOSIS — K648 Other hemorrhoids: Secondary | ICD-10-CM | POA: Insufficient documentation

## 2014-06-13 DIAGNOSIS — K625 Hemorrhage of anus and rectum: Secondary | ICD-10-CM | POA: Insufficient documentation

## 2014-06-13 DIAGNOSIS — Z96659 Presence of unspecified artificial knee joint: Secondary | ICD-10-CM | POA: Insufficient documentation

## 2014-06-13 DIAGNOSIS — E7800 Pure hypercholesterolemia, unspecified: Secondary | ICD-10-CM | POA: Insufficient documentation

## 2014-06-13 DIAGNOSIS — B188 Other chronic viral hepatitis: Secondary | ICD-10-CM | POA: Insufficient documentation

## 2014-06-13 HISTORY — PX: COLONOSCOPY WITH PROPOFOL: SHX5780

## 2014-06-13 SURGERY — COLONOSCOPY WITH PROPOFOL
Anesthesia: Monitor Anesthesia Care

## 2014-06-13 MED ORDER — GLYCOPYRROLATE 0.2 MG/ML IJ SOLN
INTRAMUSCULAR | Status: AC
  Filled 2014-06-13: qty 1

## 2014-06-13 MED ORDER — METOCLOPRAMIDE HCL 5 MG/ML IJ SOLN
INTRAMUSCULAR | Status: DC | PRN
  Administered 2014-06-13: 10 mg via INTRAVENOUS

## 2014-06-13 MED ORDER — METOCLOPRAMIDE HCL 5 MG/ML IJ SOLN
INTRAMUSCULAR | Status: AC
  Filled 2014-06-13: qty 2

## 2014-06-13 MED ORDER — SODIUM CHLORIDE 0.9 % IV SOLN: INTRAVENOUS | Status: DC

## 2014-06-13 MED ORDER — LACTATED RINGERS IV SOLN
INTRAVENOUS | Status: DC
  Administered 2014-06-13: 1000 mL via INTRAVENOUS

## 2014-06-13 MED ORDER — PROPOFOL INFUSION 10 MG/ML OPTIME
INTRAVENOUS | Status: DC | PRN
  Administered 2014-06-13: 300 ug/kg/min via INTRAVENOUS

## 2014-06-13 MED ORDER — PROPOFOL 10 MG/ML IV BOLUS
INTRAVENOUS | Status: AC
  Filled 2014-06-13: qty 20

## 2014-06-13 MED ORDER — LIDOCAINE HCL (CARDIAC) 20 MG/ML IV SOLN
INTRAVENOUS | Status: DC | PRN
  Administered 2014-06-13 (×2): 50 mg via INTRAVENOUS

## 2014-06-13 MED ORDER — LIDOCAINE HCL (CARDIAC) 20 MG/ML IV SOLN
INTRAVENOUS | Status: AC
  Filled 2014-06-13: qty 5

## 2014-06-13 SURGICAL SUPPLY — 21 items

## 2014-06-13 NOTE — Transfer of Care (Signed)
Immediate Anesthesia Transfer of Care Note  Patient: [name]  Procedure(s) Performed: Procedure(s): COLONOSCOPY WITH PROPOFOL (N/A)  Patient Location: [facility]  Anesthesia Type:MAC  Level of Consciousness: Patient easily awoken, sedated, comfortable, cooperative, following commands, responds to stimulation.   Airway & Oxygen Therapy: Patient spontaneously breathing, ventilating well, oxygen via simple oxygen mask.  Post-op Assessment: Report given to PACU RN, vital signs reviewed and stable, moving all extremities.   Post vital signs: Reviewed and stable.  Complications: No apparent anesthesia complications

## 2014-06-13 NOTE — Anesthesia Preprocedure Evaluation (Signed)
Anesthesia Evaluation  Patient identified by MRN, date of birth, ID band Patient awake    Reviewed: Allergy & Precautions, H&P , NPO status , Patient's Chart, lab work & pertinent test results  History of Anesthesia Complications (+) PROLONGED EMERGENCENegative for: history of anesthetic complications  Airway Mallampati: II TM Distance: >3 FB Neck ROM: Full    Dental no notable dental hx.  Multiple caps covering all teeth:   Pulmonary neg pulmonary ROS,  breath sounds clear to auscultation  Pulmonary exam normal       Cardiovascular hypertension, Pt. on medications Rhythm:Regular Rate:Normal     Neuro/Psych negative neurological ROS  negative psych ROS   GI/Hepatic negative GI ROS, (+) Hepatitis -, A  Endo/Other  negative endocrine ROS  Renal/GU negative Renal ROS     Musculoskeletal negative musculoskeletal ROS (+)   Abdominal   Peds  Hematology negative hematology ROS (+)   Anesthesia Other Findings   Reproductive/Obstetrics negative OB ROS                           Anesthesia Physical  Anesthesia Plan  ASA: II  Anesthesia Plan: MAC   Post-op Pain Management:    Induction: Intravenous  Airway Management Planned:   Additional Equipment:   Intra-op Plan:   Post-operative Plan:   Informed Consent: I have reviewed the patients History and Physical, chart, labs and discussed the procedure including the risks, benefits and alternatives for the proposed anesthesia with the patient or authorized representative who has indicated his/her understanding and acceptance.   Dental advisory given  Plan Discussed with: CRNA  Anesthesia Plan Comments:         Anesthesia Quick Evaluation

## 2014-06-13 NOTE — H&P (Signed)
[name] is an 80 y.o. female.   Chief Complaint: Rectal bleeding, history of colon polyps HPI: This patient was seen recently my office with a history of intermittent small volume painless hematochezia. There is a history of significant sigmoid diverticulosis and a remote history of a solitary diminutive adenomatous polyp but her most recent colonoscopy, 5 years ago, was negative for polyps. The patient has had some further rectal bleeding since I saw her in the office about 3 weeks ago, but her stool was Hemoccult negative at the time I checked her.  Past Medical History  Diagnosis Date  . Hypertension   . Elevated cholesterol   . Varicose veins   . Complication of anesthesia     SLOW TO AWAKE AFTER 2008 SURGERY, PREFERS SPINAL IF POSSIBLE  . Arthritis     osteoarthritis  . Diverticulosis 2001  . Hepatitis 1979    HEPATITIS A WITH JAUNDICE- NO ISSUES NOW    Past Surgical History  Procedure Laterality Date  . Dilation and curettage of uterus  YRS AGO    X 2  . Right rotator cuff repair  2005  . Right total knee replacment  2008  . Total knee arthroplasty  01/19/2012    Procedure: TOTAL KNEE ARTHROPLASTY;  Surgeon: Gearlean Alf, MD;  Location: [facility];  Service: Orthopedics;  Laterality: Left;  . Joint replacement  12/2011    left knee  . Joint replacement  2008    right knee  . Tonsillectomy      at 18 yrs. old  . Knee closed reduction  05/15/2012    Procedure: CLOSED MANIPULATION KNEE;  Surgeon: Gearlean Alf, MD;  Location: [facility];  Service: Orthopedics;  Laterality: Left;  . Complex hyperplasic  2002    w/o atypia in an  endocx polyp  . Hysteroscopy  12/2000    D&C    Family History  Problem Relation Age of Onset  . CVA Mother   . Heart failure Mother   . Heart failure Father    Social History:  reports that she has never smoked. She has never used smokeless tobacco. She reports that she drinks about .5 ounces of alcohol per week. She reports that she does not  use illicit drugs.  Allergies: No Known Allergies  Medications Prior to Admission  Medication Sig Dispense Refill  . aspirin EC 81 MG tablet Take 81 mg by mouth every morning.      . benazepril (LOTENSIN) 20 MG tablet Take 10 mg by mouth every evening.       Marland Kitchen BIOTIN PO Take 1,000 mg by mouth daily.      . Calcium Citrate-Vitamin D (CITRACAL + D PO) Take 2 tablets by mouth 2 (two) times daily.       Marland Kitchen glucosamine-chondroitin 500-400 MG tablet Take 1 tablet by mouth 2 (two) times daily.       . indapamide (LOZOL) 2.5 MG tablet Take 2.5 mg by mouth daily before breakfast.      . Multiple Vitamin (MULITIVITAMIN WITH MINERALS) TABS Take 1 tablet by mouth daily.      . rosuvastatin (CRESTOR) 10 MG tablet Take 10 mg by mouth 3 (three) times a week. Monday, Wednesday, Fridays      . traZODone (DESYREL) 50 MG tablet Take 50 mg by mouth at bedtime.      Marland Kitchen acetaminophen (TYLENOL) 500 MG tablet Take 1,000 mg by mouth every 6 (six) hours as needed for moderate pain.  No results found for this or any previous visit (from the past 48 hour(s)). No results found.  ROS negative for abdominal pain, significant change in bowel habits, unintentional weight loss, anorexia, or malaise  Blood pressure 158/72, temperature 97.9 F (36.6 C), temperature source Oral, resp. rate 10, height 5' 5.5" (1.664 m), weight 81.647 kg (180 lb), SpO2 99.00%. Physical Exam a very pleasant patient, normal weight, good spirits. Chest clear, heart without gallop, rub, murmur, clicks, or arrhythmia. Abdomen without mass or tenderness. No overt neurologic deficits  Assessment/Plan Rectal bleeding in the setting of a remote history of a solitary diminutive adenomatous polyp, 5 years since her last surveillance colonoscopy. We will proceed to colonoscopy at this time, using the ultraslim scope to facilitate negotiating her known sigmoid diverticular disease with associated fixation of the colon.  BUCCINI,ROBERT V 06/13/2014,  2:02 PM

## 2014-06-13 NOTE — Op Note (Signed)
[facility] [place], [address]   COLONOSCOPY PROCEDURE REPORT  PATIENT: Melissa, [name]  MR#: [id number] BIRTHDATE: [birth year] , 62  yrs. old GENDER: Female ENDOSCOPIST: Ronald Lobo, MD REFERRED BY:   Shon Baton, MD PROCEDURE DATE:  06/13/2014 PROCEDURE:     colonoscopy ASA CLASS: INDICATIONS:  rectal bleeding and remote history of solitary diminutive adenomatous polyp MEDICATIONS:    MAC, per anesthesia  DESCRIPTION OF PROCEDURE: the patient came as an outpatient to the [facility] long endoscopy unit, provided written consent, and was sedated with propofol by anesthesia. Time out was performed.  The Pentax ultraslim colonoscope was used for this exam, based on a prior history of severe sigmoid diverticular disease and associated fixation. CO2 insufflation was used as well.  The scope was advanced without significant difficulty through the sigmoid region and then advanced, with the help of some external abdominal compression, to the base of the cecum which was identified by visualization of the appendiceal orifice and the ileo-cecal valve. Pullback was then performed and a gradual fashion.  The quality of the prep was excellent and it is felt that essentially all areas were well seen.  The patient had moderately severe pancolonic diverticulosis, most prominent in the sigmoid region.  This was otherwise a normal examination. No polyps or masses, colitis or vascular ectasia were noted.  A retroflexed view of the rectum, and as well as pullout through the anal canal, failed to show any excessive hemorrhoidal disease. Mild internal hemorrhoids were found during pullout through the anal canal.  No biopsies were obtained. The patient tolerated the procedure well.     COMPLICATIONS: None  ENDOSCOPIC IMPRESSION:  1. Extensive diverticulosis 2. Mild internal hemorrhoids, which are the presumed source of the patient's recent rectal  bleeding. Presumably, they may have been more engorged at the time bleeding occurred 3. No evidence of recurrent polyps  RECOMMENDATIONS:  1. Reassurance regarding rectal bleeding 2. In view of the favorable findings on the current examination, and taking into account the patient's age, it is not felt that further routine surveillance colonoscopies will be needed in the future    _______________________________ eSigned:  Ronald Lobo, MD 06/13/2014 2:52 PM     PATIENT NAME:  Melissa, [name] MR#: [id number]

## 2014-06-13 NOTE — Discharge Instructions (Signed)
No further routine colonoscopy needed.  If minor rectal bleeding, similar to what you've had in the past, occurs in the future, no evaluation is needed.  However, we would need to see you if persistent or severe bleeding occurs, or if bleeding occurs in association with diarrhea or cramps.

## 2014-06-14 ENCOUNTER — Encounter (HOSPITAL_COMMUNITY): Payer: Self-pay | Admitting: Gastroenterology

## 2014-06-14 NOTE — Anesthesia Postprocedure Evaluation (Signed)
Anesthesia Post Note  Patient: [name]  Procedure(s) Performed: Procedure(s) (LRB): COLONOSCOPY WITH PROPOFOL (N/A)  Anesthesia type: MAC  Patient location: PACU  Post pain: Pain level controlled  Post assessment: Post-op Vital signs reviewed  Last Vitals: BP 127/67  Pulse 62  Temp(Src) 36.4 C (Oral)  Resp 15  Ht 5' 5.5" (1.664 m)  Wt 180 lb (81.647 kg)  BMI 29.49 kg/m2  SpO2 100%  Post vital signs: Reviewed  Level of consciousness: awake  Complications: No apparent anesthesia complications

## 2014-06-26 HISTORY — PX: SQUAMOUS CELL CARCINOMA EXCISION: SHX2433

## 2014-08-06 ENCOUNTER — Telehealth: Payer: Self-pay | Admitting: Obstetrics and Gynecology

## 2014-08-06 NOTE — Telephone Encounter (Signed)
LMTCB with spouse need to rs pts appt due to surgery.

## 2014-08-12 ENCOUNTER — Ambulatory Visit: Payer: Medicare Other | Admitting: Obstetrics and Gynecology

## 2014-08-12 ENCOUNTER — Encounter: Payer: Self-pay | Admitting: Obstetrics and Gynecology

## 2014-08-12 ENCOUNTER — Ambulatory Visit (INDEPENDENT_AMBULATORY_CARE_PROVIDER_SITE_OTHER): Payer: Medicare Other | Admitting: Obstetrics and Gynecology

## 2014-08-12 VITALS — BP 140/80 | HR 76 | Resp 16 | Ht 64.5 in | Wt 183.2 lb

## 2014-08-12 DIAGNOSIS — N3946 Mixed incontinence: Secondary | ICD-10-CM

## 2014-08-12 MED ORDER — OXYBUTYNIN CHLORIDE ER 5 MG PO TB24: 5.0000 mg | ORAL_TABLET | Freq: Every day | ORAL | Status: DC

## 2014-08-12 NOTE — Progress Notes (Signed)
Patient ID: [name], female   DOB: [birth year], 80 y.o.   MRN: [id number] GYNECOLOGY VISIT  PCP:   Shon Baton, MD  Referring provider:   HPI: 80 y.o.   Married  Caucasian  female   G3P3 with Patient's last menstrual period was 12/27/2000.   here for   AEX.  Lives at [place] and loves it!  Having urinary leakage which is progressive.  Leakage with sneezing, coughing, laughing, and just in general. Wearing a pad.  DF - voids every 4 hours.  NF - once per hs.  No enuresis.  Voids well.   No history of hematuria, stones, UTIs, or pyelonephritis.  No prior surgery or evaluation.   No problems with bowel movements.   Coffee in am.  Decaf tea.  No caffeine sodas. Tea with citrus daily - 2 - 3 per day.  Used Enabolex in the past and did not see a difference.  Declines surgery.   No history of cardia arrhythmia or glaucoma.   Hgb:    PCP Urine:  Negative dip.   GYNECOLOGIC HISTORY: Patient's last menstrual period was 12/27/2000. Sexually active:  no Partner preference: female Contraception:   postmenopausal Menopausal hormone therapy:  DES exposure:   no Blood transfusions:  Unsure(may have with her knee replacements)  Sexually transmitted diseases:   no GYN procedures and prior surgeries:  D & C x2.  History of complex hyperplasia without atypia in an endometrial polyp 2002 - D & C  Last mammogram:05-21-14 dense breasts, otherwise normal:[facility].               Last pap and high risk HPV testing:  07-14-10 normal  History of abnormal pap smear:  no   OB History   Grav Para Term Preterm Abortions TAB SAB Ect Mult Living   3 3        3        LIFESTYLE: Exercise:    Works out 3x weekly at [facility]: Tetanus/TDap:  2011 HPV:                 n/a Influenza:         09/2013   Bone density:   Scheduled for 08-13-14 at Dr. Keane Police office: history of Osteopenia.  Patient is on drug holiday from  Fosamax. Colonoscopy:  05/2014 diverticulosis, otherwise normal with Dr. Cristina Gong.  No further colonoscopies recommended per Dr. Cristina Gong.  Cholesterol check:  Under control with medication  Family History  Problem Relation Age of Onset  . CVA Mother   . Heart failure Mother   . Heart failure Father     There are no active problems to display for this patient.  Past Medical History  Diagnosis Date  . Hypertension   . Elevated cholesterol   . Varicose veins   . Complication of anesthesia     SLOW TO AWAKE AFTER 2008 SURGERY, PREFERS SPINAL IF POSSIBLE  . Arthritis     osteoarthritis  . Diverticulosis 2001  . Hepatitis 1979    HEPATITIS A WITH JAUNDICE- NO ISSUES NOW    Past Surgical History  Procedure Laterality Date  . Dilation and curettage of uterus  YRS AGO    X 2  . Right rotator cuff repair  2005  . Right total knee replacment  2008  . Total knee arthroplasty  01/19/2012    Procedure: TOTAL KNEE ARTHROPLASTY;  Surgeon:  Gearlean Alf, MD;  Location: [facility];  Service: Orthopedics;  Laterality: Left;  . Joint replacement  12/2011    left knee  . Joint replacement  2008    right knee  . Tonsillectomy      at 18 yrs. old  . Knee closed reduction  05/15/2012    Procedure: CLOSED MANIPULATION KNEE;  Surgeon: Gearlean Alf, MD;  Location: [facility];  Service: Orthopedics;  Laterality: Left;  . Complex hyperplasic  2002    w/o atypia in an  endocx polyp  . Hysteroscopy  12/2000    D&C  . Colonoscopy with propofol N/A 06/13/2014    Procedure: COLONOSCOPY WITH PROPOFOL;  Surgeon: Cleotis Nipper, MD;  Location: [facility];  Service: Endoscopy;  Laterality: N/A;    ALLERGIES: Review of patient's allergies indicates no known allergies.  Current Outpatient Prescriptions  Medication Sig Dispense Refill  . acetaminophen (TYLENOL) 500 MG tablet Take 1,000 mg by mouth every 6 (six) hours as needed for moderate pain.      Marland Kitchen aspirin EC 81 MG tablet Take 81 mg by mouth every  morning.      . benazepril (LOTENSIN) 20 MG tablet Take 10 mg by mouth every evening.       Marland Kitchen BIOTIN PO Take 1,000 mg by mouth daily.      . Calcium Citrate-Vitamin D (CITRACAL + D PO) Take 2 tablets by mouth 2 (two) times daily.       Marland Kitchen glucosamine-chondroitin 500-400 MG tablet Take 1 tablet by mouth 2 (two) times daily.       . indapamide (LOZOL) 2.5 MG tablet Take 2.5 mg by mouth daily before breakfast.      . Multiple Vitamin (MULITIVITAMIN WITH MINERALS) TABS Take 1 tablet by mouth daily.      . rosuvastatin (CRESTOR) 10 MG tablet Take 10 mg by mouth 3 (three) times a week. Monday, Wednesday, Fridays      . traZODone (DESYREL) 50 MG tablet Take 50 mg by mouth at bedtime.       No current facility-administered medications for this visit.     ROS:  Pertinent items are noted in HPI.  History   Social History  . Marital Status: Married    Spouse Name: N/A    Number of Children: N/A  . Years of Education: N/A   Occupational History  . Not on file.   Social History Main Topics  . Smoking status: Never Smoker   . Smokeless tobacco: Never Used  . Alcohol Use: 1.0 oz/week    2 drink(s) per week     Comment: 2glasses of wine a week  . Drug Use: No  . Sexual Activity: No   Other Topics Concern  . Not on file   Social History Narrative  . No narrative on file    PHYSICAL EXAMINATION:    BP 140/80  Pulse 76  Resp 16  Ht 5' 4.5" (1.638 m)  Wt 183 lb 3.2 oz (83.099 kg)  BMI 30.97 kg/m2  LMP 12/27/2000   Wt Readings from Last 3 Encounters:  08/12/14 183 lb 3.2 oz (83.099 kg)  06/13/14 180 lb (81.647 kg)  06/13/14 180 lb (81.647 kg)     Ht Readings from Last 3 Encounters:  08/12/14 5' 4.5" (1.638 m)  06/13/14 5' 5.5" (1.664 m)  06/13/14 5' 5.5" (1.664 m)    General appearance: alert, cooperative and appears stated age Head: Normocephalic, without obvious abnormality, atraumatic Neck: no adenopathy, supple,  symmetrical, trachea midline and thyroid not enlarged,  symmetric, no tenderness/mass/nodules Lungs: clear to auscultation bilaterally Breasts: Inspection negative, No nipple retraction or dimpling, No nipple discharge or bleeding, No axillary or supraclavicular adenopathy, Normal to palpation without dominant masses Heart: regular rate and rhythm Abdomen: soft, non-tender; no masses,  no organomegaly Extremities: extremities normal, atraumatic, no cyanosis or edema Skin: Skin color, texture, turgor normal. No rashes or lesions Lymph nodes: Cervical, supraclavicular, and axillary nodes normal. No abnormal inguinal nodes palpated Neurologic: Grossly normal  Pelvic: External genitalia:  no lesions              Urethra:  normal appearing urethra with no masses, tenderness or lesions              Bartholins and Skenes: normal                 Vagina: normal appearing vagina with normal color and discharge, no lesions              Cervix: normal appearance              Pap and high risk HPV testing done: No.        Bimanual Exam:  Uterus:  uterus is normal size, shape, consistency and nontender                                      Adnexa: normal adnexa in size, nontender and no masses                                      Rectovaginal:  Yes.                                        Confirms above.                                      Anus:  normal sphincter tone, no lesions  ASSESSMENT  Normal gynecologic exam. Mixed incontinence.  Osteopenia. Followed by PCP.   PLAN  Mammogram recommended yearly starting at age 3. Pap smear and high risk HPV testing as above. Counseled on self breast exam, Calcium and vitamin D intake, exercise. See lab orders: No. Ditropan XL 5 mg daily.  #30, RF one.  Discussed proper use and side effects including urinary retention.  Discussed avoidance of bladder irritants.  If medication is not helpful, may refer to physical therapy.  Recheck in 6 weeks.   Return annually or prn   An After Visit Summary was printed  and given to the patient.

## 2014-08-12 NOTE — Patient Instructions (Signed)

## 2014-09-23 ENCOUNTER — Encounter: Payer: Self-pay | Admitting: Obstetrics and Gynecology

## 2014-09-23 ENCOUNTER — Ambulatory Visit (INDEPENDENT_AMBULATORY_CARE_PROVIDER_SITE_OTHER): Payer: Medicare Other | Admitting: Obstetrics and Gynecology

## 2014-09-23 VITALS — BP 120/60 | HR 80 | Resp 16 | Ht 64.5 in | Wt 182.0 lb

## 2014-09-23 DIAGNOSIS — N3946 Mixed incontinence: Secondary | ICD-10-CM

## 2014-09-23 MED ORDER — OXYBUTYNIN CHLORIDE ER 10 MG PO TB24: ORAL_TABLET | ORAL | Status: DC

## 2014-09-23 NOTE — Progress Notes (Signed)
GYNECOLOGY  VISIT   HPI: 80 y.o.   Married  Caucasian  female   G3P3 with Patient's last menstrual period was 12/27/2000.   here for   Follow up- Mixed Incontinence   Having urinary leakage which is progressive.  Leakage with sneezing, coughing, laughing, and just in general.  Wearing a pad.  DF - voids every 4 hours.  NF - at least twice per night.  No enuresis.  Voids well.  No history of hematuria, stones, UTIs, or pyelonephritis.  No prior surgery or evaluation.  No problems with bowel movements.  Coffee in am.  Decaf tea.  No caffeine sodas.  Tea with citrus daily - 2 - 3 per day.  Used Enabolex in the past and did not see a difference.  Declines surgery.  No history of cardia arrhythmia or glaucoma.   Started Ditropan XL 5 mg daily in the evening.  Not having as many accidents of urinary leakage.  Now up at night  2 - 3 times, which is not a change.  Having dreams that she needs to wake up to void.  Voiding 3 - 4 times days. Little bit of dry mouth and constipation, but "nothing serious." Eyes are more dry.  Taking Trazadone for sleep as well.   Declines physical therapy.   GYNECOLOGIC HISTORY: Patient's last menstrual period was 12/27/2000. Contraception: Post-Menopausal    Menopausal hormone therapy: No        OB History   Grav Para Term Preterm Abortions TAB SAB Ect Mult Living   3 3        3          There are no active problems to display for this patient.   Past Medical History  Diagnosis Date  . Hypertension   . Elevated cholesterol   . Varicose veins   . Complication of anesthesia     SLOW TO AWAKE AFTER 2008 SURGERY, PREFERS SPINAL IF POSSIBLE  . Arthritis     osteoarthritis  . Diverticulosis 2001  . Hepatitis 1979    HEPATITIS A WITH JAUNDICE- NO ISSUES NOW    Past Surgical History  Procedure Laterality Date  . Dilation and curettage of uterus  YRS AGO    X 2  . Right rotator cuff repair  2005  . Right total knee replacment  2008  .  Total knee arthroplasty  01/19/2012    Procedure: TOTAL KNEE ARTHROPLASTY;  Surgeon: Gearlean Alf, MD;  Location: [facility];  Service: Orthopedics;  Laterality: Left;  . Joint replacement  12/2011    left knee  . Joint replacement  2008    right knee  . Tonsillectomy      at 18 yrs. old  . Knee closed reduction  05/15/2012    Procedure: CLOSED MANIPULATION KNEE;  Surgeon: Gearlean Alf, MD;  Location: [facility];  Service: Orthopedics;  Laterality: Left;  . Complex hyperplasic  2002    w/o atypia in an  endocx polyp  . Hysteroscopy  12/2000    D&C  . Colonoscopy with propofol N/A 06/13/2014    Procedure: COLONOSCOPY WITH PROPOFOL;  Surgeon: Cleotis Nipper, MD;  Location: [facility];  Service: Endoscopy;  Laterality: N/A;  . Squamous cell carcinoma excision  06/2014    -left leg    Current Outpatient Prescriptions  Medication Sig Dispense Refill  . aspirin EC 81 MG tablet Take 81 mg by mouth every morning.      . benazepril (LOTENSIN) 20 MG tablet  Take 10 mg by mouth every evening.       Marland Kitchen BIOTIN PO Take 1,000 mg by mouth daily.      . Calcium Citrate-Vitamin D (CITRACAL + D PO) Take 2 tablets by mouth 2 (two) times daily.       Marland Kitchen glucosamine-chondroitin 500-400 MG tablet Take 1 tablet by mouth 2 (two) times daily.       . indapamide (LOZOL) 2.5 MG tablet Take 2.5 mg by mouth daily before breakfast.      . Multiple Vitamin (MULITIVITAMIN WITH MINERALS) TABS Take 1 tablet by mouth daily.      Marland Kitchen oxybutynin (DITROPAN XL) 5 MG 24 hr tablet Take 1 tablet (5 mg total) by mouth at bedtime.  30 tablet  1  . rosuvastatin (CRESTOR) 10 MG tablet Take 10 mg by mouth 3 (three) times a week. Monday, Wednesday, Fridays      . traZODone (DESYREL) 50 MG tablet Take 50 mg by mouth at bedtime.       No current facility-administered medications for this visit.     ALLERGIES: Review of patient's allergies indicates no known allergies.  Family History  Problem Relation Age of Onset  . CVA Mother   .  Heart failure Mother   . Heart failure Father   . Breast cancer Sister     History   Social History  . Marital Status: Married    Spouse Name: N/A    Number of Children: N/A  . Years of Education: N/A   Occupational History  . Not on file.   Social History Main Topics  . Smoking status: Never Smoker   . Smokeless tobacco: Never Used  . Alcohol Use: 1.0 oz/week    2 drink(s) per week     Comment: 2glasses of wine a week  . Drug Use: No  . Sexual Activity: No   Other Topics Concern  . Not on file   Social History Narrative  . No narrative on file    ROS:  Pertinent items are noted in HPI.  PHYSICAL EXAMINATION:    BP 120/60  Pulse 80  Resp 16  Ht 5' 4.5" (1.638 m)  Wt 182 lb (82.555 kg)  BMI 30.77 kg/m2  LMP 12/27/2000     General appearance: alert, cooperative and appears stated age  ASSESSMENT  Mixed incontinence.  Incontinence improved on Ditropan XL.  Nocturia continues.   PLAN  Increase Ditropan XL to 10 mg daily.  Discussed side effects of increasing the dosage, including urinary retention.  Discussed stool softeners and sugar free candy to treat constipation and dry mouth.  She will call back to let us know how she is doing on the dosage change.  Declines physical therapy.   Return prn and for annual exam.   An After Visit Summary was printed and given to the patient.  __15____ minutes face to face time of which over 50% was spent in counseling.

## 2014-09-23 NOTE — Patient Instructions (Signed)
I will increase your Ditropan XL to 10 mg daily.  Call the office to let me know how you are doing - either that you are doing well or that we need to reduce the dosage back to 5 mg daily.

## 2014-10-28 ENCOUNTER — Encounter: Payer: Self-pay | Admitting: Obstetrics and Gynecology

## 2015-04-21 ENCOUNTER — Other Ambulatory Visit (HOSPITAL_COMMUNITY): Payer: Self-pay | Admitting: Internal Medicine

## 2015-04-21 DIAGNOSIS — Z1231 Encounter for screening mammogram for malignant neoplasm of breast: Secondary | ICD-10-CM

## 2015-05-27 ENCOUNTER — Ambulatory Visit (HOSPITAL_COMMUNITY): Payer: Medicare Other

## 2015-05-29 ENCOUNTER — Other Ambulatory Visit: Payer: Self-pay | Admitting: *Deleted

## 2015-05-29 MED ORDER — OXYBUTYNIN CHLORIDE ER 10 MG PO TB24: ORAL_TABLET | ORAL | Status: DC

## 2015-05-29 NOTE — Telephone Encounter (Addendum)
Medication refill request: Oxybutynin Chloride TER 10 mg Last AEX:  09/23/14 with BS Next AEX: 08/20/15 With BS Last MMG (if hormonal medication request): 05/21/14 BI-RADS 1: Negative Refill authorized: #30/2 rfs, please advise.

## 2015-07-03 ENCOUNTER — Ambulatory Visit (HOSPITAL_COMMUNITY)
Admission: RE | Admit: 2015-07-03 | Discharge: 2015-07-03 | Disposition: A | Discharge status: Home / Self Care | Payer: Medicare Other | Source: Ambulatory Visit | Attending: Internal Medicine | Admitting: Internal Medicine

## 2015-07-03 DIAGNOSIS — Z1231 Encounter for screening mammogram for malignant neoplasm of breast: Secondary | ICD-10-CM

## 2015-08-20 ENCOUNTER — Encounter: Payer: Self-pay | Admitting: Obstetrics and Gynecology

## 2015-08-20 ENCOUNTER — Ambulatory Visit (INDEPENDENT_AMBULATORY_CARE_PROVIDER_SITE_OTHER): Payer: Medicare Other | Admitting: Obstetrics and Gynecology

## 2015-08-20 VITALS — BP 104/60 | HR 88 | Resp 20 | Ht 64.5 in | Wt 177.0 lb

## 2015-08-20 DIAGNOSIS — Z01419 Encounter for gynecological examination (general) (routine) without abnormal findings: Secondary | ICD-10-CM

## 2015-08-20 DIAGNOSIS — N3281 Overactive bladder: Secondary | ICD-10-CM | POA: Diagnosis not present

## 2015-08-20 NOTE — Progress Notes (Signed)
81 y.o. G3P3 Married Caucasian female here for annual exam.    Patient without complaints. Denies any postmenopausal bleeding.  Rx for Oxybutynin XL 10 mg caused constipation.  PCP suggested she take it 3 days a week and this seems to be controlling her overactive bladder well and controlling her constipation.  Denies dry mouth.  Does not need a refill on this from me.   Husband with medical problems - CHF, Crohn's, and Parkinsons.  Was in the hospital and is now back at [place] but not in their apartment yet.  PCP:   Dr. Virgina Jock.  Patient's last menstrual period was 12/27/2000.          Sexually active: No.  The current method of family planning is post menopausal status.    Exercising: No.   Smoker:  no  Health Maintenance: Pap:   2011 - normal. History of abnormal Pap:  no MMG:   07/03/15 - [facility] - BI-RADS1 Colonoscopy:   2015 normal BMD:   Osteopenia  Result  2015 - PCP.  Took Fosamax in the past from PCP. TDaP:   2011 Screening Labs:  PCP   reports that she has never smoked. She has never used smokeless tobacco. She reports that she drinks about 1.0 oz of alcohol per week. She reports that she does not use illicit drugs.  Past Medical History  Diagnosis Date  . Hypertension   . Elevated cholesterol   . Varicose veins   . Complication of anesthesia     SLOW TO AWAKE AFTER 2008 SURGERY, PREFERS SPINAL IF POSSIBLE  . Arthritis     osteoarthritis  . Diverticulosis 2001  . Hepatitis 1979    HEPATITIS A WITH JAUNDICE- NO ISSUES NOW    Past Surgical History  Procedure Laterality Date  . Dilation and curettage of uterus  YRS AGO    X 2  . Right rotator cuff repair  2005  . Right total knee replacment  2008  . Total knee arthroplasty  01/19/2012    Procedure: TOTAL KNEE ARTHROPLASTY;  Surgeon: Gearlean Alf, MD;  Location: [facility];  Service: Orthopedics;  Laterality: Left;  . Joint replacement  12/2011    left knee  . Joint replacement  2008    right knee  .  Tonsillectomy      at 18 yrs. old  . Knee closed reduction  05/15/2012    Procedure: CLOSED MANIPULATION KNEE;  Surgeon: Gearlean Alf, MD;  Location: [facility];  Service: Orthopedics;  Laterality: Left;  . Complex hyperplasic  2002    w/o atypia in an  endocx polyp  . Hysteroscopy  12/2000    D&C  . Colonoscopy with propofol N/A 06/13/2014    Procedure: COLONOSCOPY WITH PROPOFOL;  Surgeon: Cleotis Nipper, MD;  Location: [facility];  Service: Endoscopy;  Laterality: N/A;  . Squamous cell carcinoma excision  06/2014    -left leg    Current Outpatient Prescriptions  Medication Sig Dispense Refill  . ALPRAZolam (XANAX) 0.25 MG tablet Take 0.25 mg by mouth at bedtime as needed for anxiety. 1/2 tab at bedtime    . aspirin EC 81 MG tablet Take 81 mg by mouth every morning.    . benazepril (LOTENSIN) 20 MG tablet Take 10 mg by mouth every evening.     Marland Kitchen BIOTIN PO Take 1,000 mg by mouth daily.    . Calcium Citrate-Vitamin D (CITRACAL + D PO) Take 2 tablets by mouth 2 (two) times daily.     Marland Kitchen  glucosamine-chondroitin 500-400 MG tablet Take 1 tablet by mouth 2 (two) times daily.     . indapamide (LOZOL) 2.5 MG tablet Take 2.5 mg by mouth daily before breakfast.    . Multiple Vitamin (MULITIVITAMIN WITH MINERALS) TABS Take 1 tablet by mouth daily.    Marland Kitchen oxybutynin (DITROPAN-XL) 10 MG 24 hr tablet Take one tablet (10 mg) by mouth daily at night time. 30 tablet 3  . rosuvastatin (CRESTOR) 10 MG tablet Take 10 mg by mouth 3 (three) times a week. Monday, Wednesday, Fridays    . traZODone (DESYREL) 50 MG tablet Take 50 mg by mouth at bedtime.     No current facility-administered medications for this visit.    Family History  Problem Relation Age of Onset  . CVA Mother   . Heart failure Mother   . Heart failure Father   . Breast cancer Sister     ROS:  Pertinent items are noted in HPI.  Otherwise, a comprehensive ROS was negative.  Exam:   BP 104/60 mmHg  Pulse 88  Resp 20  Ht 5' 4.5" (1.638  m)  Wt 177 lb (80.287 kg)  BMI 29.92 kg/m2  LMP 12/27/2000    General appearance: alert, cooperative and appears stated age Head: Normocephalic, without obvious abnormality, atraumatic Neck: no adenopathy, supple, symmetrical, trachea midline and thyroid normal to inspection and palpation Lungs: clear to auscultation bilaterally Breasts: normal appearance, no masses or tenderness, Inspection negative, No nipple retraction or dimpling, No nipple discharge or bleeding, No axillary or supraclavicular adenopathy Heart: regular rate and rhythm Abdomen: soft, non-tender; bowel sounds normal; no masses,  no organomegaly Extremities: extremities normal, atraumatic, no cyanosis or edema Skin: Skin color, texture, turgor normal. No rashes or lesions Lymph nodes: Cervical, supraclavicular, and axillary nodes normal. No abnormal inguinal nodes palpated Neurologic: Grossly normal  Pelvic: External genitalia:  no lesions              Urethra:  normal appearing urethra with no masses, tenderness or lesions              Bartholins and Skenes: normal                 Vagina: normal appearing vagina with normal color and discharge, no lesions              Cervix: no lesions              Pap taken: No. Bimanual Exam:  Uterus:  normal size, contour, position, consistency, mobility, non-tender              Adnexa: normal adnexa and no mass, fullness, tenderness              Rectovaginal: Yes.  .  Confirms.              Anus:  normal sphincter tone, no lesions  Chaperone was present for exam.  Assessment:   Well woman visit with normal exam. Overactive bladder.  Osteopenia.   Plan: Yearly mammogram recommended after age 75.  Recommended self breast exam.  Pap and HR HPV as above. Discussed Calcium, Vitamin D, regular exercise program including cardiovascular and weight bearing exercise. Labs performed.  No..   See orders. Refills given on medications.  No..  See orders.  Patient will have pharmacy  contact office if she needs refill on Ditropan XL 10 mg.  Follow up annually and prn.      After visit summary provided.   Of note,  EPIC had a system wide failure during the patient's entire visit.  This information was transcribed following her visit today.

## 2016-01-27 ENCOUNTER — Other Ambulatory Visit: Payer: Self-pay | Admitting: *Deleted

## 2016-01-27 MED ORDER — OXYBUTYNIN CHLORIDE ER 10 MG PO TB24: ORAL_TABLET | ORAL | Status: DC

## 2016-01-27 NOTE — Telephone Encounter (Signed)
Medication refill request: Oxybutynin  Last AEX:  08-20-15 Next AEX: 08-25-16 Last MMG (if hormonal medication request): 07-03-15 WNL Refill authorized: please advise

## 2016-06-11 ENCOUNTER — Other Ambulatory Visit: Payer: Self-pay | Admitting: Internal Medicine

## 2016-06-11 DIAGNOSIS — Z1231 Encounter for screening mammogram for malignant neoplasm of breast: Secondary | ICD-10-CM

## 2016-07-05 ENCOUNTER — Ambulatory Visit
Admission: RE | Admit: 2016-07-05 | Discharge: 2016-07-05 | Disposition: A | Discharge status: Home / Self Care | Payer: Medicare Other | Source: Ambulatory Visit | Attending: Internal Medicine | Admitting: Internal Medicine

## 2016-07-05 DIAGNOSIS — Z1231 Encounter for screening mammogram for malignant neoplasm of breast: Secondary | ICD-10-CM

## 2016-08-20 NOTE — Progress Notes (Signed)
82 y.o. G3P3 Married Caucasian female here for annual exam.    Requests pap.   Taking Ditropan XL 10 mg three times a week.  This reduces side effects. Denies sudden urgency.  Has bladder "cystitis" symptoms periodically.  Nothing that is persistent.  Last UTI was years ago. Uses Miralax as needed for constipation symptoms.   Had bilateral cataract surgery.   Having a calcium recheck.   Just had a family reunion of 82 family members.  Husband with health issues. They live at [facility].   PCP:  Shon Baton, MD   Patient's last menstrual period was 12/27/2000.           Sexually active: No. female The current method of family planning is post menopausal status.    Exercising: Yes.    Cardio and strength 3x/week Smoker:  no  Health Maintenance: Pap:  06/2010 Neg History of abnormal Pap:  no MMG:  07-05-16 Density B/Neg/BiRads1:[facility] Colonoscopy:  2015 normal Dr. Tamala Bari has aged out. BMD: 06/2016  Result:  Osteopenia with PCP--Took Fosamax in the past--off right now TDaP:  2011 Gardasil:   N/A   Screening Labs:  Hb today: PCP, Urine today: 1+WBCs   reports that she has never smoked. She has never used smokeless tobacco. She reports that she drinks about 1.0 oz of alcohol per week . She reports that she does not use drugs.  Past Medical History:  Diagnosis Date  . Arthritis    osteoarthritis  . Complication of anesthesia    SLOW TO AWAKE AFTER 2008 SURGERY, PREFERS SPINAL IF POSSIBLE  . Diverticulosis 2001  . Elevated cholesterol   . Hepatitis 1979   HEPATITIS A WITH JAUNDICE- NO ISSUES NOW  . Hypertension   . Varicose veins     Past Surgical History:  Procedure Laterality Date  . COLONOSCOPY WITH PROPOFOL N/A 06/13/2014   Procedure: COLONOSCOPY WITH PROPOFOL;  Surgeon: Cleotis Nipper, MD;  Location: [facility];  Service: Endoscopy;  Laterality: N/A;  . COMPLEX HYPERPLASIC  2002   w/o atypia in an  endocx polyp  . DILATION AND CURETTAGE  OF UTERUS  YRS AGO   X 2  . HYSTEROSCOPY  12/2000   D&C  . JOINT REPLACEMENT  12/2011   left knee  . JOINT REPLACEMENT  2008   right knee  . KNEE CLOSED REDUCTION  05/15/2012   Procedure: CLOSED MANIPULATION KNEE;  Surgeon: Gearlean Alf, MD;  Location: [facility];  Service: Orthopedics;  Laterality: Left;  . RIGHT ROTATOR CUFF REPAIR  2005  . RIGHT TOTAL KNEE REPLACMENT  2008  . SQUAMOUS CELL CARCINOMA EXCISION  06/2014   -left leg  . TONSILLECTOMY     at 18 yrs. old  . TOTAL KNEE ARTHROPLASTY  01/19/2012   Procedure: TOTAL KNEE ARTHROPLASTY;  Surgeon: Gearlean Alf, MD;  Location: [facility];  Service: Orthopedics;  Laterality: Left;    Current Outpatient Prescriptions  Medication Sig Dispense Refill  . aspirin EC 81 MG tablet Take 81 mg by mouth every morning.    . benazepril (LOTENSIN) 20 MG tablet Take 10 mg by mouth every evening.     Marland Kitchen BIOTIN PO Take 1,000 mg by mouth daily.    . Calcium Citrate-Vitamin D (CITRACAL + D PO) Take 2 tablets by mouth 2 (two) times daily.     Marland Kitchen glucosamine-chondroitin 500-400 MG tablet Take 1 tablet by mouth 2 (two) times daily.     . indapamide (LOZOL) 2.5 MG tablet  Take 2.5 mg by mouth. Takes 1 tablet with breakfast and 1 tablet at 10:00am    . Multiple Vitamin (MULITIVITAMIN WITH MINERALS) TABS Take 1 tablet by mouth daily.    Marland Kitchen oxybutynin (DITROPAN-XL) 10 MG 24 hr tablet Take one tablet 3 times/week. 30 tablet 6  . rosuvastatin (CRESTOR) 10 MG tablet Take 10 mg by mouth 3 (three) times a week. Monday, Wednesday, Fridays    . traZODone (DESYREL) 50 MG tablet Take 50 mg by mouth at bedtime.     No current facility-administered medications for this visit.     Family History  Problem Relation Age of Onset  . CVA Mother   . Heart failure Mother   . Heart failure Father   . Breast cancer Sister     ROS:  Pertinent items are noted in HPI.  Otherwise, a comprehensive ROS was negative.  Exam:   BP 126/78 (BP Location: Right Arm, Patient Position:  Sitting, Cuff Size: Large)   Pulse 70   Resp 16   Ht 5\' 4"  (1.626 m)   Wt 176 lb 3.2 oz (79.9 kg)   LMP 12/27/2000   BMI 30.24 kg/m     General appearance: alert, cooperative and appears stated age Head: Normocephalic, without obvious abnormality, atraumatic Neck: no adenopathy, supple, symmetrical, trachea midline and thyroid normal to inspection and palpation Lungs: clear to auscultation bilaterally Breasts: normal appearance, no masses or tenderness, No nipple retraction or dimpling, No nipple discharge or bleeding, No axillary or supraclavicular adenopathy Heart: regular rate and rhythm Abdomen: soft, non-tender; no masses, no organomegaly Extremities: extremities normal, atraumatic, no cyanosis or edema Skin: Skin color, texture, turgor normal. No rashes or lesions Lymph nodes: Cervical, supraclavicular, and axillary nodes normal. No abnormal inguinal nodes palpated Neurologic: Grossly normal  Pelvic: External genitalia:  no lesions              Urethra:  normal appearing urethra with no masses, tenderness or lesions              Bartholins and Skenes: normal                 Vagina: normal appearing vagina with normal color and discharge, no lesions              Cervix: no lesions              Pap taken: Yes.   Bimanual Exam:  Uterus:  normal size, contour, position, consistency, mobility, non-tender              Adnexa: no mass, fullness, tenderness              Rectal exam: Yes.  .  Confirms.              Anus:  normal sphincter tone, no lesions  Chaperone was present for exam.  Assessment:   Well woman visit with normal exam. Osteopenia.  Overactive bladder.  FH of breast cancer in sister.  Abnormal urine.   Plan: Yearly mammogram recommended after age 65.  Recommended self breast exam.  Pap and HR HPV as above. Discussed Calcium, Vitamin D, regular exercise program including cardiovascular and weight bearing exercise. Refill of Ditropan Xl 10 mg three times  weekly.  Discussed Miralax and sugar free gum/candy and Biotene for oral dryness.   Urine micro and cx. Follow up annually and prn.       After visit summary provided.

## 2016-08-25 ENCOUNTER — Ambulatory Visit: Payer: Medicare Other | Admitting: Obstetrics and Gynecology

## 2016-09-01 ENCOUNTER — Ambulatory Visit (INDEPENDENT_AMBULATORY_CARE_PROVIDER_SITE_OTHER): Payer: Medicare Other | Admitting: Obstetrics and Gynecology

## 2016-09-01 ENCOUNTER — Encounter: Payer: Self-pay | Admitting: Obstetrics and Gynecology

## 2016-09-01 VITALS — BP 126/78 | HR 70 | Resp 16 | Ht 64.0 in | Wt 176.2 lb

## 2016-09-01 DIAGNOSIS — Z01419 Encounter for gynecological examination (general) (routine) without abnormal findings: Secondary | ICD-10-CM

## 2016-09-01 DIAGNOSIS — R8290 Unspecified abnormal findings in urine: Secondary | ICD-10-CM

## 2016-09-01 LAB — POCT URINALYSIS DIPSTICK
BILIRUBIN UA: NEGATIVE
GLUCOSE UA: NEGATIVE
KETONES UA: NEGATIVE
Nitrite, UA: NEGATIVE
Protein, UA: NEGATIVE
RBC UA: NEGATIVE
Urobilinogen, UA: NEGATIVE
pH, UA: 7

## 2016-09-01 MED ORDER — OXYBUTYNIN CHLORIDE ER 10 MG PO TB24: ORAL_TABLET | ORAL | 6 refills | Status: DC

## 2016-09-01 NOTE — Patient Instructions (Signed)

## 2016-09-02 LAB — URINALYSIS, MICROSCOPIC ONLY
BACTERIA UA: NONE SEEN [HPF]
CASTS: NONE SEEN [LPF]
CRYSTALS: NONE SEEN [HPF]
YEAST: NONE SEEN [HPF]

## 2016-09-02 LAB — IPS PAP SMEAR ONLY

## 2016-09-02 LAB — URINE CULTURE: Organism ID, Bacteria: 10000

## 2016-09-15 ENCOUNTER — Ambulatory Visit (INDEPENDENT_AMBULATORY_CARE_PROVIDER_SITE_OTHER): Payer: Medicare Other | Admitting: Obstetrics and Gynecology

## 2016-09-15 ENCOUNTER — Encounter: Payer: Self-pay | Admitting: Obstetrics and Gynecology

## 2016-09-15 VITALS — BP 130/70 | HR 80 | Ht 64.0 in | Wt 175.0 lb

## 2016-09-15 DIAGNOSIS — N390 Urinary tract infection, site not specified: Secondary | ICD-10-CM

## 2016-09-15 DIAGNOSIS — R8281 Pyuria: Secondary | ICD-10-CM

## 2016-09-15 NOTE — Progress Notes (Signed)
GYNECOLOGY  VISIT   HPI: 82 y.o.   Married  Caucasian  female   G3P3 with Patient's last menstrual period was 12/27/2000.   here for catheter specimen to recheck urine.   Had a clean catch urine specimen on 09/01/16 at routine annual exam and was noted to have 1+ WBCs on urine dip. Follow up urine micro showed 10 - 20 WBCs, 0 - 2 RBCs, 0 - 5 squams, no bacteria.  Final UC showed multiple organisms each less than 10,000.  Patient is treated for overactive bladder syndrome and takes Ditropan XL.  Has cystitis symptoms periodically.   Last UTI was years ago.  GYNECOLOGIC HISTORY: Patient's last menstrual period was 12/27/2000. Contraception:  Postmenopausal Menopausal hormone therapy:  none Last mammogram:  07-05-16 Density B/Neg/BiRads1:[facility]. Last pap smear:   06/2010 Neg         OB History    Gravida Para Term Preterm AB Living   3 3       3    SAB TAB Ectopic Multiple Live Births                     Patient Active Problem List   Diagnosis Date Noted  . Mixed incontinence 09/23/2014    Past Medical History:  Diagnosis Date  . Arthritis    osteoarthritis  . Complication of anesthesia    SLOW TO AWAKE AFTER 2008 SURGERY, PREFERS SPINAL IF POSSIBLE  . Diverticulosis 2001  . Elevated cholesterol   . Hepatitis 1979   HEPATITIS A WITH JAUNDICE- NO ISSUES NOW  . Hypertension   . Varicose veins     Past Surgical History:  Procedure Laterality Date  . cataract surgery  2016 and 2017   bilateral surgery  . COLONOSCOPY WITH PROPOFOL N/A 06/13/2014   Procedure: COLONOSCOPY WITH PROPOFOL;  Surgeon: Cleotis Nipper, MD;  Location: [facility];  Service: Endoscopy;  Laterality: N/A;  . COMPLEX HYPERPLASIC  2002   w/o atypia in an  endocx polyp  . DILATION AND CURETTAGE OF UTERUS  YRS AGO   X 2  . HYSTEROSCOPY  12/2000   D&C  . JOINT REPLACEMENT  12/2011   left knee  . JOINT REPLACEMENT  2008   right knee  . KNEE CLOSED REDUCTION  05/15/2012   Procedure:  CLOSED MANIPULATION KNEE;  Surgeon: Gearlean Alf, MD;  Location: [facility];  Service: Orthopedics;  Laterality: Left;  . RIGHT ROTATOR CUFF REPAIR  2005  . RIGHT TOTAL KNEE REPLACMENT  2008  . SQUAMOUS CELL CARCINOMA EXCISION  06/2014   -left leg  . TONSILLECTOMY     at [age] yrs. old  . TOTAL KNEE ARTHROPLASTY  01/19/2012   Procedure: TOTAL KNEE ARTHROPLASTY;  Surgeon: Gearlean Alf, MD;  Location: [facility];  Service: Orthopedics;  Laterality: Left;    Current Outpatient Prescriptions  Medication Sig Dispense Refill  . aspirin EC 81 MG tablet Take 81 mg by mouth every morning.    . benazepril (LOTENSIN) 10 MG tablet Take 1 tablet by mouth daily.    Marland Kitchen BIOTIN PO Take 1,000 mg by mouth daily.    . Calcium Citrate-Vitamin D (CITRACAL + D PO) Take 2 tablets by mouth 2 (two) times daily.     Marland Kitchen glucosamine-chondroitin 500-400 MG tablet Take 1 tablet by mouth 2 (two) times daily.     . indapamide (LOZOL) 2.5 MG tablet Take 2.5 mg by mouth. Takes 1 tablet with breakfast and 1 tablet at  10:00am    . Multiple Vitamin (MULITIVITAMIN WITH MINERALS) TABS Take 1 tablet by mouth daily.    Marland Kitchen oxybutynin (DITROPAN-XL) 10 MG 24 hr tablet Take one tablet 3 times/week. 30 tablet 6  . rosuvastatin (CRESTOR) 10 MG tablet Take 10 mg by mouth 3 (three) times a week. Monday, Wednesday, Fridays    . traZODone (DESYREL) 100 MG tablet Take 1 tablet by mouth at bedtime.     No current facility-administered medications for this visit.      ALLERGIES: Review of patient's allergies indicates no known allergies.  Family History  Problem Relation Age of Onset  . CVA Mother   . Heart failure Mother   . Heart failure Father   . Breast cancer Sister     Social History   Social History  . Marital status: Married    Spouse name: N/A  . Number of children: N/A  . Years of education: N/A   Occupational History  . Not on file.   Social History Main Topics  . Smoking status: Never Smoker  . Smokeless tobacco: Never  Used  . Alcohol use 1.0 oz/week    2 Standard drinks or equivalent per week     Comment: 2glasses of wine a week  . Drug use: No  . Sexual activity: No   Other Topics Concern  . Not on file   Social History Narrative  . No narrative on file    ROS:  Pertinent items are noted in HPI.  PHYSICAL EXAMINATION:    BP 130/70 (BP Location: Right Arm, Patient Position: Sitting, Cuff Size: Normal)   Pulse 80   Ht 5\' 4"  (1.626 m)   Wt 175 lb (79.4 kg)   LMP 12/27/2000   BMI 30.04 kg/m     General appearance: alert, cooperative and appears stated age   Pelvic: External genitalia:  no lesions              Urethra:  normal appearing urethra with no masses, tenderness or lesions                         Bimanual Exam:  Uterus:  normal size, contour, position, consistency, mobility, non-tender              Adnexa: no mass, fullness, tenderness         Catherization for sterile urine specimen: Verbal consent for procedure.  Sterile prep with betadine.  Sterile urine obtained. No complications.   Chaperone was present for exam.  ASSESSMENT  Pyuria.  Overactive bladder syndrome.  PLAN  Urinalysis, micro, and culture.  Urine cytology. Rational for testing explained to patient who expresses appreciation for her care.  An After Visit Summary was printed and given to the patient.  __15____ minutes face to face time of which over 50% was spent in counseling.

## 2016-09-16 LAB — URINALYSIS, MICROSCOPIC ONLY
Bacteria, UA: NONE SEEN [HPF]
CASTS: NONE SEEN [LPF]
Crystals: NONE SEEN [HPF]
RBC / HPF: NONE SEEN RBC/HPF (ref <=2)
Squamous Epithelial / LPF: NONE SEEN [HPF] (ref <=5)
WBC, UA: NONE SEEN WBC/HPF (ref <=5)
YEAST: NONE SEEN [HPF]

## 2016-09-16 LAB — URINALYSIS
BILIRUBIN URINE: NEGATIVE
Glucose, UA: NEGATIVE
Hgb urine dipstick: NEGATIVE
KETONES UR: NEGATIVE
LEUKOCYTES UA: NEGATIVE
NITRITE: NEGATIVE
PROTEIN: NEGATIVE
Specific Gravity, Urine: 1.022 (ref 1.001–1.035)
pH: 6 (ref 5.0–8.0)

## 2016-09-17 LAB — URINE CULTURE: Organism ID, Bacteria: NO GROWTH

## 2016-11-25 ENCOUNTER — Emergency Department (HOSPITAL_COMMUNITY)
Admission: EM | Admit: 2016-11-25 | Discharge: 2016-11-26 | Disposition: A | Discharge status: Home / Self Care | Payer: Medicare Other | Attending: Emergency Medicine | Admitting: Emergency Medicine

## 2016-11-25 ENCOUNTER — Emergency Department (HOSPITAL_COMMUNITY): Payer: Medicare Other

## 2016-11-25 ENCOUNTER — Encounter (HOSPITAL_COMMUNITY): Payer: Self-pay | Admitting: *Deleted

## 2016-11-25 DIAGNOSIS — I10 Essential (primary) hypertension: Secondary | ICD-10-CM | POA: Insufficient documentation

## 2016-11-25 DIAGNOSIS — S8001XA Contusion of right knee, initial encounter: Secondary | ICD-10-CM | POA: Diagnosis not present

## 2016-11-25 DIAGNOSIS — Y939 Activity, unspecified: Secondary | ICD-10-CM | POA: Diagnosis not present

## 2016-11-25 DIAGNOSIS — W010XXA Fall on same level from slipping, tripping and stumbling without subsequent striking against object, initial encounter: Secondary | ICD-10-CM | POA: Insufficient documentation

## 2016-11-25 DIAGNOSIS — Y999 Unspecified external cause status: Secondary | ICD-10-CM | POA: Insufficient documentation

## 2016-11-25 DIAGNOSIS — Z79899 Other long term (current) drug therapy: Secondary | ICD-10-CM | POA: Diagnosis not present

## 2016-11-25 DIAGNOSIS — W19XXXA Unspecified fall, initial encounter: Secondary | ICD-10-CM

## 2016-11-25 DIAGNOSIS — Z96653 Presence of artificial knee joint, bilateral: Secondary | ICD-10-CM | POA: Diagnosis not present

## 2016-11-25 DIAGNOSIS — Y929 Unspecified place or not applicable: Secondary | ICD-10-CM | POA: Diagnosis not present

## 2016-11-25 DIAGNOSIS — Z7982 Long term (current) use of aspirin: Secondary | ICD-10-CM | POA: Insufficient documentation

## 2016-11-25 DIAGNOSIS — S8991XA Unspecified injury of right lower leg, initial encounter: Secondary | ICD-10-CM

## 2016-11-25 MED ORDER — KETOROLAC TROMETHAMINE 60 MG/2ML IM SOLN
30.0000 mg | Freq: Once | INTRAMUSCULAR | Status: AC
  Administered 2016-11-25: 30 mg via INTRAMUSCULAR
  Filled 2016-11-25: qty 2

## 2016-11-25 MED ORDER — METHOCARBAMOL 500 MG PO TABS
500.0000 mg | ORAL_TABLET | Freq: Once | ORAL | Status: AC
  Administered 2016-11-25: 500 mg via ORAL
  Filled 2016-11-25: qty 1

## 2016-11-25 NOTE — ED Triage Notes (Signed)
Per EMS report: pt coming in from [facility] after pt fell.  Pt was carrying in groceries and slipped and fell at around 16:00.  Pt was advised to be evaluated by her Ortho office since pt has a hx of bilateral knee replacement.  Pt denies LOC or hitting her head.  Pt denies any pain.  Pt's right knee is bruised and swelling is noted but pt has a +2 DP pulse distal to injury.  Pt also report breaking the fall with her right arm.  Pt a/o x 4 and normally ambulates with a cane.  Pt has full ROM to right leg and arm.

## 2016-11-25 NOTE — Discharge Instructions (Signed)
You have been seen today for knee pain following a fall. Your imaging showed no acute abnormalities. Weightbearing as tolerated. Follow-up with orthopedics as soon as possible for continued management of this issue. Ibuprofen, naproxen, or Tylenol for pain management. Keep the extremity elevated whenever possible.

## 2016-11-25 NOTE — ED Notes (Signed)
Bed: [id number] Expected date:  Expected time:  Means of arrival:  Comments: 82 yo F  Fall, right knee pain

## 2016-11-25 NOTE — ED Provider Notes (Signed)
[facility] DEPT Provider Note   CSN: [id number] Arrival date & time: 11/25/16  2039 By signing my name below, I, Dyke Brackett, attest that this documentation has been prepared under the direction and in the presence of non-physician practitioner, Arlean Hopping, PA-C. Electronically Signed: Dyke Brackett, Scribe. 11/25/2016. 9:37 PM.  History   Chief Complaint Chief Complaint  Patient presents with  . Fall   HPI  [name] is a 82 y.o. female, with hx of arthritis, HTN, and bilateral knee joint replacement, who presents to the Emergency Department complaining of sudden onset, moderate right knee pain s/p fall today at 4 pm. Pt states she was carrying her groceries when she fell, landing on her right knee. She describes her pain as cramping. Pain is exacerbated by walking or moving. Pt initially denied any pain, but rates it as a 10/10 after physical exam. She notes associated muscle tightness, bruising and swelling to the area. Previous relief of pain with Tylenol. Pt has hx of bilateral knee replacement; left knee was replaced in 2013 and right in 2008. Pt denies any hip pain, dizziness, CP, SOB, syncope, neck pain, back pain, or any other complaints or injuries.    The history is provided by the patient. No language interpreter was used.    Past Medical History:  Diagnosis Date  . Arthritis    osteoarthritis  . Complication of anesthesia    SLOW TO AWAKE AFTER 2008 SURGERY, PREFERS SPINAL IF POSSIBLE  . Diverticulosis 2001  . Elevated cholesterol   . Hepatitis 1979   HEPATITIS A WITH JAUNDICE- NO ISSUES NOW  . Hypertension   . Varicose veins     Patient Active Problem List   Diagnosis Date Noted  . Mixed incontinence 09/23/2014    Past Surgical History:  Procedure Laterality Date  . cataract surgery  2016 and 2017   bilateral surgery  . COLONOSCOPY WITH PROPOFOL N/A 06/13/2014   Procedure: COLONOSCOPY WITH PROPOFOL;  Surgeon: Cleotis Nipper, MD;  [facility]: [facility];  Service: Endoscopy;  Laterality: N/A;  . COMPLEX HYPERPLASIC  2002   w/o atypia in an  endocx polyp  . DILATION AND CURETTAGE OF UTERUS  YRS AGO   X 2  . HYSTEROSCOPY  12/2000   D&C  . JOINT REPLACEMENT  12/2011   left knee  . JOINT REPLACEMENT  2008   right knee  . KNEE CLOSED REDUCTION  05/15/2012   Procedure: CLOSED MANIPULATION KNEE;  Surgeon: Gearlean Alf, MD;  Location: [facility];  Service: Orthopedics;  Laterality: Left;  . RIGHT ROTATOR CUFF REPAIR  2005  . RIGHT TOTAL KNEE REPLACMENT  2008  . SQUAMOUS CELL CARCINOMA EXCISION  06/2014   -left leg  . TONSILLECTOMY     at 18 yrs. old  . TOTAL KNEE ARTHROPLASTY  01/19/2012   Procedure: TOTAL KNEE ARTHROPLASTY;  Surgeon: Gearlean Alf, MD;  Location: [facility];  Service: Orthopedics;  Laterality: Left;    OB History    Gravida Para Term Preterm AB Living   3 3       3    SAB TAB Ectopic Multiple Live Births                   Home Medications    Prior to Admission medications   Medication Sig Start Date End Date Taking? Authorizing Provider  acetaminophen (TYLENOL) 500 MG tablet Take 1,000 mg by mouth every 6 (six) hours as needed for mild pain, moderate pain, fever  or headache.   Yes Historical Provider, MD  aspirin EC 81 MG tablet Take 81 mg by mouth at bedtime.    Yes Historical Provider, MD  benazepril (LOTENSIN) 10 MG tablet Take 1 tablet by mouth every evening.  09/01/16  Yes Historical Provider, MD  BIOTIN PO Take 1,000 mg by mouth daily with breakfast.    Yes Historical Provider, MD  Calcium Citrate-Vitamin D (CITRACAL + D PO) Take 2 tablets by mouth 2 (two) times daily.    Yes Historical Provider, MD  fluorometholone (FML) 0.1 % ophthalmic suspension Place 1 drop into both eyes daily as needed for dry eyes. 10/29/16  Yes Historical Provider, MD  glucosamine-chondroitin 500-400 MG tablet Take 1 tablet by mouth 2 (two) times daily.    Yes Historical Provider, MD  indapamide (LOZOL) 2.5 MG tablet Take 2.5 mg by  mouth daily with breakfast.    Yes Historical Provider, MD  Multiple Vitamin (MULITIVITAMIN WITH MINERALS) TABS Take 1 tablet by mouth daily with breakfast.    Yes Historical Provider, MD  oxybutynin (DITROPAN-XL) 10 MG 24 hr tablet Take one tablet 3 times/week. Patient taking differently: Take 10 mg by mouth 3 (three) times a week.  09/01/16  Yes Brook Oletta Lamas, MD  rosuvastatin (CRESTOR) 10 MG tablet Take 10 mg by mouth every Monday, Wednesday, and Friday.    Yes Historical Provider, MD  traZODone (DESYREL) 100 MG tablet Take 1 tablet by mouth at bedtime. 06/22/16  Yes Historical Provider, MD    Family History Family History  Problem Relation Age of Onset  . CVA Mother   . Heart failure Mother   . Heart failure Father   . Breast cancer Sister     Social History Social History  Substance Use Topics  . Smoking status: Never Smoker  . Smokeless tobacco: Never Used  . Alcohol use 1.0 oz/week    2 Standard drinks or equivalent per week     Comment: 2glasses of wine a week     Allergies   Patient has no known allergies.  Review of Systems Review of Systems  Respiratory: Negative for shortness of breath.   Cardiovascular: Negative for chest pain.  Musculoskeletal: Positive for arthralgias and myalgias.  Skin: Positive for color change.  Neurological: Negative for dizziness and syncope.  All other systems reviewed and are negative.  Physical Exam Updated Vital Signs BP 145/70 (BP Location: Left Arm)   Pulse 66   Temp 97.5 F (36.4 C) (Oral)   Resp 15   LMP 12/27/2000   SpO2 99% Comment: RA  Physical Exam  Constitutional: She appears well-developed and well-nourished. No distress.  HENT:  Head: Normocephalic and atraumatic.  Mouth/Throat: Oropharynx is clear and moist.  Eyes: Conjunctivae and EOM are normal. Pupils are equal, round, and reactive to light.  Neck: Normal range of motion. Neck supple.  Cardiovascular: Normal rate, regular rhythm, normal heart  sounds and intact distal pulses.   Pulmonary/Chest: Effort normal and breath sounds normal. No respiratory distress.  Abdominal: Soft. There is no tenderness. There is no guarding.  Musculoskeletal: She exhibits edema and tenderness.  Swelling, ecchymosis and tenderness to the right anterior knee as well as over the medial aspect. Pain elicited with both varus and valgus stress. No laxity noted. Distal pulses intact. Firmness and tenderness to the posterior, proximal right lower leg. Patient has full range of motion in the right ankle, knee, and hip in all cardinal directions. Normal motor function intact in all other  extremities and spine. No midline spinal tenderness.   Neurological: She is alert. No sensory deficit.  No sensory deficits. Strength 5/5 in all extremities. No gait disturbance. Coordination intact. Cranial nerves III-XII grossly intact. No facial droop.   Skin: Skin is warm and dry. She is not diaphoretic.  Psychiatric: She has a normal mood and affect. Her behavior is normal.  Nursing note and vitals reviewed.  ED Treatments / Results  DIAGNOSTIC STUDIES:  Oxygen Saturation is 100% on RA, normal by my interpretation.    COORDINATION OF CARE:  9:39 PM Will order DG knee complete and DG Tibia/fibula right. Discussed treatment plan with pt at bedside and pt agreed to plan.   Labs (all labs ordered are listed, but only abnormal results are displayed) Labs Reviewed - No data to display  EKG  EKG Interpretation None       Radiology Dg Tibia/fibula Right  Result Date: 11/25/2016 CLINICAL DATA:  Status post fall EXAM: RIGHT KNEE - COMPLETE 4+ VIEW; RIGHT TIBIA AND FIBULA - 2 VIEW COMPARISON:  None. FINDINGS: There is a right total knee arthroplasty with well-seated femoral and tibial components. No periprosthetic fracture or evidence of loosening. There is severe soft tissue swelling overlying the knee. No fracture or dislocation of the tibia or fibula. The ankle is  approximated. IMPRESSION: No acute fracture or dislocation of the right knee or of the tibia and fibula. Electronically Signed   By: Ulyses Jarred M.D.   On: 11/25/2016 22:52   Dg Knee Complete 4 Views Right  Result Date: 11/25/2016 CLINICAL DATA:  Status post fall EXAM: RIGHT KNEE - COMPLETE 4+ VIEW; RIGHT TIBIA AND FIBULA - 2 VIEW COMPARISON:  None. FINDINGS: There is a right total knee arthroplasty with well-seated femoral and tibial components. No periprosthetic fracture or evidence of loosening. There is severe soft tissue swelling overlying the knee. No fracture or dislocation of the tibia or fibula. The ankle is approximated. IMPRESSION: No acute fracture or dislocation of the right knee or of the tibia and fibula. Electronically Signed   By: Ulyses Jarred M.D.   On: 11/25/2016 22:52    Procedures Procedures (including critical care time)  Medications Ordered in ED Medications  methocarbamol (ROBAXIN) tablet 500 mg (500 mg Oral Given 11/25/16 2308)  ketorolac (TORADOL) injection 30 mg (30 mg Intramuscular Given 11/25/16 2308)     Initial Impression / Assessment and Plan / ED Course  I have reviewed the triage vital signs and the nursing notes.  Pertinent labs & imaging results that were available during my care of the patient were reviewed by me and considered in my medical decision making (see chart for details).  Clinical Course     Patient presents with right knee pain and swelling following a fall earlier today. No acute abnormalities on x-ray. Complete relief of pain during the ED course. Patient ambulated without difficulty or assistance. Orthopedic follow-up. Return precautions discussed.   Findings and plan of care discussed with Margette Fast, MD. Dr. Laverta Baltimore personally evaluated and examined this patient.   Vitals:   11/25/16 2048 11/25/16 2051 11/25/16 2122 11/25/16 2321  BP: 145/70  136/73 143/75  Pulse: 66  (!) 57 76  Resp: 15   16  Temp: 97.5 F (36.4 C)       TempSrc: Oral     SpO2: 100% 99% 100% 100%     Final Clinical Impressions(s) / ED Diagnoses   Final diagnoses:  Fall, initial encounter  Injury of right  knee, initial encounter    New Prescriptions Discharge Medication List as of 11/25/2016 11:50 PM     I personally performed the services described in this documentation, which was scribed in my presence. The recorded information has been reviewed and is accurate.   Lorayne Bender, PA-C 11/26/16 [place], MD 11/26/16 [phone]

## 2016-11-25 NOTE — ED Triage Notes (Signed)
Pt given 1000mg  of tylenol at 19:30 by staff at [facility]

## 2016-11-25 NOTE — ED Notes (Signed)
Pt was able to ambulate unassisted for 10 feet.

## 2016-11-26 NOTE — ED Notes (Signed)
Patient was alert, oriented and stable upon discharge. RN went over AVS and patient had no further questions.  

## 2017-05-12 ENCOUNTER — Other Ambulatory Visit: Payer: Self-pay | Admitting: Internal Medicine

## 2017-05-12 DIAGNOSIS — I951 Orthostatic hypotension: Secondary | ICD-10-CM

## 2017-05-12 DIAGNOSIS — I1310 Hypertensive heart and chronic kidney disease without heart failure, with stage 1 through stage 4 chronic kidney disease, or unspecified chronic kidney disease: Secondary | ICD-10-CM

## 2017-05-12 DIAGNOSIS — R42 Dizziness and giddiness: Secondary | ICD-10-CM

## 2017-05-12 DIAGNOSIS — N184 Chronic kidney disease, stage 4 (severe): Secondary | ICD-10-CM

## 2017-05-13 ENCOUNTER — Other Ambulatory Visit (HOSPITAL_COMMUNITY): Payer: Medicare Other

## 2017-05-30 ENCOUNTER — Encounter (INDEPENDENT_AMBULATORY_CARE_PROVIDER_SITE_OTHER): Payer: Self-pay

## 2017-05-30 ENCOUNTER — Other Ambulatory Visit: Payer: Self-pay

## 2017-05-30 ENCOUNTER — Ambulatory Visit (HOSPITAL_COMMUNITY): Payer: Medicare Other | Attending: Cardiovascular Disease

## 2017-05-30 DIAGNOSIS — I951 Orthostatic hypotension: Secondary | ICD-10-CM | POA: Diagnosis not present

## 2017-05-30 DIAGNOSIS — N189 Chronic kidney disease, unspecified: Secondary | ICD-10-CM | POA: Insufficient documentation

## 2017-05-30 DIAGNOSIS — I509 Heart failure, unspecified: Secondary | ICD-10-CM | POA: Diagnosis not present

## 2017-05-30 DIAGNOSIS — N184 Chronic kidney disease, stage 4 (severe): Secondary | ICD-10-CM

## 2017-05-30 DIAGNOSIS — I351 Nonrheumatic aortic (valve) insufficiency: Secondary | ICD-10-CM | POA: Insufficient documentation

## 2017-05-30 DIAGNOSIS — I1310 Hypertensive heart and chronic kidney disease without heart failure, with stage 1 through stage 4 chronic kidney disease, or unspecified chronic kidney disease: Secondary | ICD-10-CM | POA: Diagnosis not present

## 2017-05-30 DIAGNOSIS — I130 Hypertensive heart and chronic kidney disease with heart failure and stage 1 through stage 4 chronic kidney disease, or unspecified chronic kidney disease: Secondary | ICD-10-CM | POA: Diagnosis not present

## 2017-05-30 DIAGNOSIS — R42 Dizziness and giddiness: Secondary | ICD-10-CM | POA: Diagnosis not present

## 2017-05-31 ENCOUNTER — Other Ambulatory Visit: Payer: Self-pay | Admitting: Internal Medicine

## 2017-05-31 DIAGNOSIS — Z1231 Encounter for screening mammogram for malignant neoplasm of breast: Secondary | ICD-10-CM

## 2017-07-07 ENCOUNTER — Ambulatory Visit
Admission: RE | Admit: 2017-07-07 | Discharge: 2017-07-07 | Disposition: A | Discharge status: Home / Self Care | Payer: Medicare Other | Source: Ambulatory Visit | Attending: Internal Medicine | Admitting: Internal Medicine

## 2017-07-07 DIAGNOSIS — Z1231 Encounter for screening mammogram for malignant neoplasm of breast: Secondary | ICD-10-CM

## 2017-09-02 ENCOUNTER — Ambulatory Visit: Payer: Medicare Other | Admitting: Obstetrics and Gynecology

## 2017-09-14 ENCOUNTER — Ambulatory Visit (INDEPENDENT_AMBULATORY_CARE_PROVIDER_SITE_OTHER): Payer: Medicare Other | Admitting: Obstetrics and Gynecology

## 2017-09-14 ENCOUNTER — Encounter: Payer: Self-pay | Admitting: Obstetrics and Gynecology

## 2017-09-14 VITALS — BP 118/70 | HR 76 | Resp 18 | Ht 64.5 in | Wt 182.4 lb

## 2017-09-14 DIAGNOSIS — Z01419 Encounter for gynecological examination (general) (routine) without abnormal findings: Secondary | ICD-10-CM

## 2017-09-14 MED ORDER — OXYBUTYNIN CHLORIDE ER 15 MG PO TB24: 15.0000 mg | ORAL_TABLET | Freq: Every day | ORAL | 0 refills | Status: DC

## 2017-09-14 NOTE — Progress Notes (Signed)
83 y.o. G3P3 Married Caucasian female here for annual exam.    No vaginal bleeding or spotting.   On Ditropan XL 10 mg three time a week for overactive bladder.  Noticing some increased urinary incontinence.  No painful urination or blood in the urine.  Wearing Poise pads due to incontinence.  No stress incontinence.   Has a lump she wants checked.  No pain.  No change in size.   Family went to the beach this year.   Caring for husband who has Parkinson's. They live at [facility].  PCP:  Shon Baton, MD  Patient's last menstrual period was 12/27/2000.           Sexually active: No. female The current method of family planning is post menopausal status.    Exercising: Yes.    Exercise classes at [facility] Smoker:  no  Health Maintenance: Pap: 09-01-16 Neg,   History of abnormal Pap:  no MMG: 07-07-17 Density B/Neg/BiRads1:TBC Colonoscopy:  2015 normal Dr. Tamala Bari has aged out. BMD: 2018  Result: Osteopenia with PCP--Took Fosamax in the past--off right now TDaP: 2011 Gardasil:   no IRJ:JOACZY Hep C: Unsure Screening Labs:  Hb today: PCP, Urine today: not done   reports that she has never smoked. She has never used smokeless tobacco. She reports that she drinks about 1.2 oz of alcohol per week . She reports that she does not use drugs.  Past Medical History:  Diagnosis Date  . Arthritis    osteoarthritis  . Complication of anesthesia    SLOW TO AWAKE AFTER 2008 SURGERY, PREFERS SPINAL IF POSSIBLE  . Diverticulosis 2001  . Elevated cholesterol   . Hepatitis 1979   HEPATITIS A WITH JAUNDICE- NO ISSUES NOW  . Hypertension   . Varicose veins     Past Surgical History:  Procedure Laterality Date  . cataract surgery  2016 and 2017   bilateral surgery  . COLONOSCOPY WITH PROPOFOL N/A 06/13/2014   Procedure: COLONOSCOPY WITH PROPOFOL;  Surgeon: Cleotis Nipper, MD;  Location: [facility];  Service: Endoscopy;  Laterality: N/A;  . COMPLEX HYPERPLASIC  2002    w/o atypia in an  endocx polyp  . DILATION AND CURETTAGE OF UTERUS  YRS AGO   X 2  . HYSTEROSCOPY  12/2000   D&C  . JOINT REPLACEMENT  12/2011   left knee  . JOINT REPLACEMENT  2008   right knee  . KNEE CLOSED REDUCTION  05/15/2012   Procedure: CLOSED MANIPULATION KNEE;  Surgeon: Gearlean Alf, MD;  Location: [facility];  Service: Orthopedics;  Laterality: Left;  . RIGHT ROTATOR CUFF REPAIR  2005  . RIGHT TOTAL KNEE REPLACMENT  2008  . SQUAMOUS CELL CARCINOMA EXCISION  06/2014   -left leg  . TONSILLECTOMY     at 18 yrs. old  . TOTAL KNEE ARTHROPLASTY  01/19/2012   Procedure: TOTAL KNEE ARTHROPLASTY;  Surgeon: Gearlean Alf, MD;  Location: [facility];  Service: Orthopedics;  Laterality: Left;    Current Outpatient Prescriptions  Medication Sig Dispense Refill  . acetaminophen (TYLENOL) 500 MG tablet Take 1,000 mg by mouth every 6 (six) hours as needed for mild pain, moderate pain, fever or headache.    Marland Kitchen aspirin EC 81 MG tablet Take 81 mg by mouth at bedtime.     . benazepril (LOTENSIN) 10 MG tablet Take 1 tablet by mouth every evening.     Marland Kitchen BIOTIN PO Take 1,000 mg by mouth daily with breakfast.     .  Calcium Citrate-Vitamin D (CITRACAL + D PO) Take 2 tablets by mouth 2 (two) times daily.     . fluorometholone (FML) 0.1 % ophthalmic suspension Place 1 drop into both eyes daily as needed for dry eyes.    Marland Kitchen glucosamine-chondroitin 500-400 MG tablet Take 1 tablet by mouth 2 (two) times daily.     . indapamide (LOZOL) 2.5 MG tablet Take 2.5 mg by mouth daily with breakfast.     . Multiple Vitamin (MULITIVITAMIN WITH MINERALS) TABS Take 1 tablet by mouth daily with breakfast.     . oxybutynin (DITROPAN-XL) 10 MG 24 hr tablet Take one tablet 3 times/week. (Patient taking differently: Take 10 mg by mouth 3 (three) times a week. ) 30 tablet 6  . rosuvastatin (CRESTOR) 10 MG tablet Take 10 mg by mouth every Monday, Wednesday, and Friday.     . traZODone (DESYREL) 100 MG tablet Take 1 tablet by  mouth at bedtime.    Marland Kitchen venlafaxine XR (EFFEXOR-XR) 37.5 MG 24 hr capsule Take 1 capsule by mouth daily.     No current facility-administered medications for this visit.     Family History  Problem Relation Age of Onset  . CVA Mother   . Heart failure Mother   . Heart failure Father   . Breast cancer Sister     ROS:  Pertinent items are noted in HPI.  Otherwise, a comprehensive ROS was negative.  Exam:   BP 118/70 (BP Location: Right Arm, Patient Position: Sitting, Cuff Size: Normal)   Pulse 76   Resp 18   Ht 5' 4.5" (1.638 m)   Wt 182 lb 6.4 oz (82.7 kg)   LMP 12/27/2000   BMI 30.83 kg/m     General appearance: alert, cooperative and appears stated age Head: Normocephalic, without obvious abnormality, atraumatic Neck: no adenopathy, supple, symmetrical, trachea midline and thyroid normal to inspection and palpation Lungs: clear to auscultation bilaterally Breasts: normal appearance, no masses or tenderness, No nipple retraction or dimpling, No nipple discharge or bleeding, No axillary or supraclavicular adenopathy Heart: regular rate and rhythm Abdomen: soft, non-tender; no masses, no organomegaly Extremities: extremities normal, atraumatic, no cyanosis or edema Skin: Skin color, texture, turgor normal. No rashes or lesions Lymph nodes: Cervical, supraclavicular, and axillary nodes normal. No abnormal inguinal nodes palpated Neurologic: Grossly normal  Pelvic: External genitalia:  no lesions.  Left labia majora with 3 mm sebaceous cyst.   Vulvar erythema (chronic pad use.)              Urethra:  normal appearing urethra with no masses, tenderness or lesions              Bartholins and Skenes: normal                 Vagina: normal appearing vagina with normal color and discharge, no lesions              Cervix: no lesions              Pap taken: No. Bimanual Exam:  Uterus:  normal size, contour, position, consistency, mobility, non-tender              Adnexa: no mass,  fullness, tenderness              Rectal exam: Yes.  .  Confirms.              Anus:  normal sphincter tone, no lesions  Chaperone was present for exam.  Assessment:  Well woman visit with normal exam. Osteopenia.  Off Fosamax. Overactive bladder.  Vulvar irritation from chronic pad use.  FH breast cancer in sister.   Plan: Mammogram screening discussed. Recommended self breast awareness. Pap and HR HPV as above. Guidelines for Calcium, Vitamin D, regular exercise program including cardiovascular and weight bearing exercise. Increase Ditropan XL15 mg 3 times per week.  Dispense 90. She will contact office if she needs refills and is doing well on this dosage. Try a pad that is hypoallergenic.  Declines Pelvic floor PT. Follow up annually and prn.      After visit summary provided.

## 2017-09-14 NOTE — Patient Instructions (Signed)

## 2017-12-30 DIAGNOSIS — L570 Actinic keratosis: Secondary | ICD-10-CM | POA: Diagnosis not present

## 2017-12-30 DIAGNOSIS — Z85828 Personal history of other malignant neoplasm of skin: Secondary | ICD-10-CM | POA: Diagnosis not present

## 2017-12-30 DIAGNOSIS — L680 Hirsutism: Secondary | ICD-10-CM | POA: Diagnosis not present

## 2018-02-17 ENCOUNTER — Encounter (HOSPITAL_COMMUNITY): Payer: Self-pay

## 2018-02-17 ENCOUNTER — Emergency Department (HOSPITAL_COMMUNITY)
Admission: EM | Admit: 2018-02-17 | Discharge: 2018-02-18 | Disposition: A | Discharge status: Home / Self Care | Payer: PPO | Attending: Emergency Medicine | Admitting: Emergency Medicine

## 2018-02-17 ENCOUNTER — Other Ambulatory Visit: Payer: Self-pay

## 2018-02-17 ENCOUNTER — Emergency Department (HOSPITAL_COMMUNITY): Payer: PPO

## 2018-02-17 DIAGNOSIS — R946 Abnormal results of thyroid function studies: Secondary | ICD-10-CM | POA: Diagnosis not present

## 2018-02-17 DIAGNOSIS — W19XXXA Unspecified fall, initial encounter: Secondary | ICD-10-CM

## 2018-02-17 DIAGNOSIS — Z79899 Other long term (current) drug therapy: Secondary | ICD-10-CM | POA: Insufficient documentation

## 2018-02-17 DIAGNOSIS — Y999 Unspecified external cause status: Secondary | ICD-10-CM | POA: Diagnosis not present

## 2018-02-17 DIAGNOSIS — S0083XA Contusion of other part of head, initial encounter: Secondary | ICD-10-CM | POA: Diagnosis not present

## 2018-02-17 DIAGNOSIS — Y9301 Activity, walking, marching and hiking: Secondary | ICD-10-CM | POA: Diagnosis not present

## 2018-02-17 DIAGNOSIS — M859 Disorder of bone density and structure, unspecified: Secondary | ICD-10-CM | POA: Diagnosis not present

## 2018-02-17 DIAGNOSIS — W228XXA Striking against or struck by other objects, initial encounter: Secondary | ICD-10-CM | POA: Diagnosis not present

## 2018-02-17 DIAGNOSIS — Z96653 Presence of artificial knee joint, bilateral: Secondary | ICD-10-CM | POA: Diagnosis not present

## 2018-02-17 DIAGNOSIS — S0081XA Abrasion of other part of head, initial encounter: Secondary | ICD-10-CM | POA: Insufficient documentation

## 2018-02-17 DIAGNOSIS — Z7982 Long term (current) use of aspirin: Secondary | ICD-10-CM | POA: Insufficient documentation

## 2018-02-17 DIAGNOSIS — Y929 Unspecified place or not applicable: Secondary | ICD-10-CM | POA: Diagnosis not present

## 2018-02-17 DIAGNOSIS — E7849 Other hyperlipidemia: Secondary | ICD-10-CM | POA: Diagnosis not present

## 2018-02-17 DIAGNOSIS — M109 Gout, unspecified: Secondary | ICD-10-CM | POA: Diagnosis not present

## 2018-02-17 DIAGNOSIS — R82998 Other abnormal findings in urine: Secondary | ICD-10-CM | POA: Diagnosis not present

## 2018-02-17 DIAGNOSIS — I10 Essential (primary) hypertension: Secondary | ICD-10-CM | POA: Diagnosis not present

## 2018-02-17 DIAGNOSIS — N183 Chronic kidney disease, stage 3 (moderate): Secondary | ICD-10-CM | POA: Diagnosis not present

## 2018-02-17 DIAGNOSIS — R030 Elevated blood-pressure reading, without diagnosis of hypertension: Secondary | ICD-10-CM

## 2018-02-17 DIAGNOSIS — S0990XA Unspecified injury of head, initial encounter: Secondary | ICD-10-CM | POA: Diagnosis not present

## 2018-02-17 DIAGNOSIS — T148XXA Other injury of unspecified body region, initial encounter: Secondary | ICD-10-CM

## 2018-02-17 NOTE — ED Triage Notes (Signed)
Pt arrives today with two sons- pt from [place], reports she was trying to help her husband with his walker when she tripped and fell, hitting her head/ face on a glass coffee table. No LOC. Denies blood thinners, but does take a baby ASA daily. Bruising, swelling, and abrasions noted around left eye and forehead. A/O x4, speaking in full sentence.

## 2018-02-17 NOTE — ED Notes (Signed)
Bed: WA13 Expected date:  Expected time:  Means of arrival:  Comments: Pt getting dressed  

## 2018-02-18 DIAGNOSIS — S0083XA Contusion of other part of head, initial encounter: Secondary | ICD-10-CM | POA: Diagnosis not present

## 2018-02-18 DIAGNOSIS — I10 Essential (primary) hypertension: Secondary | ICD-10-CM | POA: Diagnosis not present

## 2018-02-18 NOTE — ED Provider Notes (Signed)
[place] DEPT Provider Note   CSN: [id number] Arrival date & time: 02/17/18  2004     History   Chief Complaint Chief Complaint  Patient presents with  . Fall    HPI [name] is a 84 y.o. female who presents the emergency department with chief complaint of mechanical fall.  Patient states that she lost her footing today while visiting friends, fell forward and hit her face on a coffee table.  This occurred at about 6:30 PM.  She did not lose consciousness.  She has some facial bruising and abrasions.  Patient was able to walk from her friends apartment back to her house prior to coming to the ED for evaluation.  She denies any change in her mentation.  She denies pain with eye movement.  She does have a small amount of pain in her knee but is able to ambulate easily.  She denies neck pain, upper extremity weakness or paresthesia.  HPI  Past Medical History:  Diagnosis Date  . Arthritis    osteoarthritis  . Complication of anesthesia    SLOW TO AWAKE AFTER 2008 SURGERY, PREFERS SPINAL IF POSSIBLE  . Diverticulosis 2001  . Elevated cholesterol   . Hepatitis 1979   HEPATITIS A WITH JAUNDICE- NO ISSUES NOW  . Hypertension   . Varicose veins     Patient Active Problem List   Diagnosis Date Noted  . Mixed incontinence 09/23/2014    Past Surgical History:  Procedure Laterality Date  . cataract surgery  2016 and 2017   bilateral surgery  . COLONOSCOPY WITH PROPOFOL N/A 06/13/2014   Procedure: COLONOSCOPY WITH PROPOFOL;  Surgeon: Cleotis Nipper, MD;  Location: [facility];  Service: Endoscopy;  Laterality: N/A;  . COMPLEX HYPERPLASIC  2002   w/o atypia in an  endocx polyp  . DILATION AND CURETTAGE OF UTERUS  YRS AGO   X 2  . HYSTEROSCOPY  12/2000   D&C  . JOINT REPLACEMENT  12/2011   left knee  . JOINT REPLACEMENT  2008   right knee  . KNEE CLOSED REDUCTION  05/15/2012   Procedure: CLOSED MANIPULATION KNEE;  Surgeon: Gearlean Alf, MD;  Location: [facility];  Service: Orthopedics;  Laterality: Left;  . RIGHT ROTATOR CUFF REPAIR  2005  . RIGHT TOTAL KNEE REPLACMENT  2008  . SQUAMOUS CELL CARCINOMA EXCISION  06/2014   -left leg  . TONSILLECTOMY     at 18 yrs. old  . TOTAL KNEE ARTHROPLASTY  01/19/2012   Procedure: TOTAL KNEE ARTHROPLASTY;  Surgeon: Gearlean Alf, MD;  Location: [facility];  Service: Orthopedics;  Laterality: Left;    OB History    Gravida Para Term Preterm AB Living   3 3       3    SAB TAB Ectopic Multiple Live Births                   Home Medications    Prior to Admission medications   Medication Sig Start Date End Date Taking? Authorizing Provider  acetaminophen (TYLENOL) 500 MG tablet Take 1,000 mg by mouth every 6 (six) hours as needed for mild pain, moderate pain, fever or headache.    [provider]  aspirin EC 81 MG tablet Take 81 mg by mouth at bedtime.     [provider]  benazepril (LOTENSIN) 10 MG tablet Take 1 tablet by mouth every evening.  09/01/16   [provider]  BIOTIN PO  Take 1,000 mg by mouth daily with breakfast.     [provider]  Calcium Citrate-Vitamin D (CITRACAL + D PO) Take 2 tablets by mouth 2 (two) times daily.     [provider]  fluorometholone (FML) 0.1 % ophthalmic suspension Place 1 drop into both eyes daily as needed for dry eyes. 10/29/16   [provider]  glucosamine-chondroitin 500-400 MG tablet Take 1 tablet by mouth 2 (two) times daily.     [provider]  indapamide (LOZOL) 2.5 MG tablet Take 2.5 mg by mouth daily with breakfast.     [provider]  Multiple Vitamin (MULITIVITAMIN WITH MINERALS) TABS Take 1 tablet by mouth daily with breakfast.     [provider]  oxybutynin (DITROPAN XL) 15 MG 24 hr tablet Take 1 tablet (15 mg total) by mouth at bedtime. Take three nights per week. 09/14/17   Nunzio Cobbs, MD  rosuvastatin (CRESTOR) 10 MG tablet Take 10  mg by mouth every Monday, Wednesday, and Friday.     [provider]  traZODone (DESYREL) 100 MG tablet Take 1 tablet by mouth at bedtime. 06/22/16   [provider]  venlafaxine XR (EFFEXOR-XR) 37.5 MG 24 hr capsule Take 1 capsule by mouth daily. 09/08/17   [provider]    Family History Family History  Problem Relation Age of Onset  . CVA Mother   . Heart failure Mother   . Heart failure Father   . Breast cancer Sister     Social History Social History   Tobacco Use  . Smoking status: Never Smoker  . Smokeless tobacco: Never Used  Substance Use Topics  . Alcohol use: Yes    Alcohol/week: 1.2 oz    Types: 2 Glasses of wine per week    Comment: 2glasses of wine a week  . Drug use: No     Allergies   Patient has no known allergies.   Review of Systems Review of Systems  Ten systems reviewed and are negative for acute change, except as noted in the HPI.   Physical Exam Updated Vital Signs BP (!) 161/74 (BP Location: Right Arm)   Pulse 66   Temp 97.8 F (36.6 C) (Oral)   Resp 14   LMP 12/27/2000   SpO2 97%   Physical Exam  Constitutional: She is oriented to person, place, and time. She appears well-developed and well-nourished. No distress.  HENT:  Head: Normocephalic.    Sheet with bruising and abrasions over her left eye orbit and nasal bridge.  No step-off or crepitus.  Extraocular movements intact and without pain.  Strong bite without evidence of dental injury.  Eyes: Conjunctivae are normal. No scleral icterus.  Neck: Normal range of motion and full passive range of motion without pain.  No midline spinal tenderness.  Full motion without pain.  Normal upper extremity strength, sensation and pulses.  Cardiovascular: Normal rate, regular rhythm and normal heart sounds. Exam reveals no gallop and no friction rub.  No murmur heard. Pulmonary/Chest: Effort normal and breath sounds normal. No respiratory distress.  Abdominal: Soft.  Bowel sounds are normal. She exhibits no distension and no mass. There is no tenderness. There is no guarding.  Musculoskeletal:  Small bruising evident over the lateral aspect of the right knee, well-healed surgical consistent with total knee arthroplasty.  Full passive and active range of motion of the knee  Neurological: She is alert and oriented to person, place, and time.  Skin:  Skin is warm and dry. She is not diaphoretic.  Psychiatric: Her behavior is normal.  Nursing note and vitals reviewed.    ED Treatments / Results  Labs (all labs ordered are listed, but only abnormal results are displayed) Labs Reviewed - No data to display  EKG  EKG Interpretation None       Radiology Ct Head Wo Contrast  Result Date: 02/17/2018 CLINICAL DATA:  Patient tripped and fell hitting face and head on glass coffee table. Bruising and swelling noted about the left eye and forehead. EXAM: CT HEAD WITHOUT CONTRAST CT MAXILLOFACIAL WITHOUT CONTRAST TECHNIQUE: Multidetector CT imaging of the head and maxillofacial structures were performed using the standard protocol without intravenous contrast. Multiplanar CT image reconstructions of the maxillofacial structures were also generated. COMPARISON:  None. FINDINGS: CT HEAD FINDINGS Brain: No evidence of acute large vascular territory infarction, hemorrhage, hydrocephalus, extra-axial collection or mass lesion/mass effect. Vascular: Mild atherosclerosis of the carotid siphons. No hyperdense vessels. Skull: No acute skull fracture Other: Left supraorbital forehead contusion. CT MAXILLOFACIAL FINDINGS Osseous: No acute maxillofacial fracture. There is facet arthropathy of the included cervical spine at C3-4 on the right and C4-5 on the left. Orbits: Intact with bilateral lens replacements. Sinuses: Mild mucosal thickening of the ethmoid sinus. No acute sinus disease. Clear mastoids. Soft tissues: Left forehead and supraorbital soft tissue swelling. IMPRESSION:  1. Left forehead and supraorbital soft tissue swelling/contusions. No acute maxillofacial fracture. 2. No acute intracranial abnormality. Electronically Signed   By: Ashley Royalty M.D.   On: 02/17/2018 22:12   Ct Maxillofacial Wo Contrast  Result Date: 02/17/2018 CLINICAL DATA:  Patient tripped and fell hitting face and head on glass coffee table. Bruising and swelling noted about the left eye and forehead. EXAM: CT HEAD WITHOUT CONTRAST CT MAXILLOFACIAL WITHOUT CONTRAST TECHNIQUE: Multidetector CT imaging of the head and maxillofacial structures were performed using the standard protocol without intravenous contrast. Multiplanar CT image reconstructions of the maxillofacial structures were also generated. COMPARISON:  None. FINDINGS: CT HEAD FINDINGS Brain: No evidence of acute large vascular territory infarction, hemorrhage, hydrocephalus, extra-axial collection or mass lesion/mass effect. Vascular: Mild atherosclerosis of the carotid siphons. No hyperdense vessels. Skull: No acute skull fracture Other: Left supraorbital forehead contusion. CT MAXILLOFACIAL FINDINGS Osseous: No acute maxillofacial fracture. There is facet arthropathy of the included cervical spine at C3-4 on the right and C4-5 on the left. Orbits: Intact with bilateral lens replacements. Sinuses: Mild mucosal thickening of the ethmoid sinus. No acute sinus disease. Clear mastoids. Soft tissues: Left forehead and supraorbital soft tissue swelling. IMPRESSION: 1. Left forehead and supraorbital soft tissue swelling/contusions. No acute maxillofacial fracture. 2. No acute intracranial abnormality. Electronically Signed   By: Ashley Royalty M.D.   On: 02/17/2018 22:12    Procedures Procedures (including critical care time)  Medications Ordered in ED Medications - No data to display   Initial Impression / Assessment and Plan / ED Course  I have reviewed the triage vital signs and the nursing notes.  Pertinent labs & imaging results that were  available during my care of the patient were reviewed by me and considered in my medical decision making (see chart for details).     Patient with mechanical fall resulting in facial trauma.  She is up-to-date on her tetanus vaccination last given in 2011.  She is mentating normally.  Her CT scans are without significant abnormality.  Patient advised to take Motrin, Tylenol and ice packs.  Her blood pressure is noted to  be slightly elevated however she is denying any chest pain shortness of breath or other neurologic symptoms.  Advised her to have it rechecked when she is not in pain and in a stressful situation.  Final Clinical Impressions(s) / ED Diagnoses   Final diagnoses:  Fall, initial encounter  Contusion of face, initial encounter  Abrasion  Elevated blood pressure reading    ED Discharge Orders    None       Margarita Mail, PA-C 02/18/18 0019

## 2018-02-18 NOTE — ED Provider Notes (Signed)
Medical screening examination/treatment/procedure(s) were conducted as a shared visit with non-physician practitioner(s) and myself.  I personally evaluated the patient during the encounter. Briefly, the patient is a 84 y.o. female who presents after mechanical fall resulting in facial trauma and right knee contusion.  Workup revealed facial abrasions with left periorbital ecchymosis.  Ocular movements intact and no evidence of entrapment.  CT head and face without acute fracture or ICH.  Right knee pain is over contusion.  No joint pain.  Full range of motion.  Do not feel that imaging is necessary at this time.  She was able to ambulate without significant complication. The patient is safe for discharge with strict return precautions. .    EKG Interpretation None           Manning Luna, Grayce Sessions, MD 02/18/18 [phone]

## 2018-02-18 NOTE — Discharge Instructions (Signed)
Get help right away if: You have severe pain or a headache that is not relieved by medicine. You have unusual sleepiness, confusion, or personality changes. You vomit. You have a nosebleed that does not stop. You have double vision or blurred vision. You have a continuous clear fluid draining from your nose or ear. You have trouble walking or using your arms or legs. You have severe dizziness. 

## 2018-02-24 DIAGNOSIS — R946 Abnormal results of thyroid function studies: Secondary | ICD-10-CM | POA: Diagnosis not present

## 2018-02-24 DIAGNOSIS — M109 Gout, unspecified: Secondary | ICD-10-CM | POA: Diagnosis not present

## 2018-02-24 DIAGNOSIS — R600 Localized edema: Secondary | ICD-10-CM | POA: Diagnosis not present

## 2018-02-24 DIAGNOSIS — E7849 Other hyperlipidemia: Secondary | ICD-10-CM | POA: Diagnosis not present

## 2018-02-24 DIAGNOSIS — I872 Venous insufficiency (chronic) (peripheral): Secondary | ICD-10-CM | POA: Diagnosis not present

## 2018-02-24 DIAGNOSIS — N183 Chronic kidney disease, stage 3 (moderate): Secondary | ICD-10-CM | POA: Diagnosis not present

## 2018-02-24 DIAGNOSIS — D696 Thrombocytopenia, unspecified: Secondary | ICD-10-CM | POA: Diagnosis not present

## 2018-02-24 DIAGNOSIS — I1310 Hypertensive heart and chronic kidney disease without heart failure, with stage 1 through stage 4 chronic kidney disease, or unspecified chronic kidney disease: Secondary | ICD-10-CM | POA: Diagnosis not present

## 2018-02-24 DIAGNOSIS — D692 Other nonthrombocytopenic purpura: Secondary | ICD-10-CM | POA: Diagnosis not present

## 2018-02-24 DIAGNOSIS — Z0000 Encounter for general adult medical examination without abnormal findings: Secondary | ICD-10-CM | POA: Diagnosis not present

## 2018-02-24 DIAGNOSIS — Z6831 Body mass index (BMI) 31.0-31.9, adult: Secondary | ICD-10-CM | POA: Diagnosis not present

## 2018-02-24 DIAGNOSIS — Z1389 Encounter for screening for other disorder: Secondary | ICD-10-CM | POA: Diagnosis not present

## 2018-03-01 DIAGNOSIS — Z1212 Encounter for screening for malignant neoplasm of rectum: Secondary | ICD-10-CM | POA: Diagnosis not present

## 2018-04-17 ENCOUNTER — Other Ambulatory Visit: Payer: Self-pay | Admitting: *Deleted

## 2018-04-17 MED ORDER — OXYBUTYNIN CHLORIDE ER 15 MG PO TB24: 15.0000 mg | ORAL_TABLET | Freq: Every day | ORAL | 1 refills | Status: DC

## 2018-04-17 NOTE — Telephone Encounter (Signed)
Fax request received from [facility] for the following:   Medication refill request: Ditropan 15 mg Last AEX:  09/14/17 BS  Next AEX: 09/15/18  Last MMG (if hormonal medication request): 07/07/17 BIRADS 1 negative  Refill authorized: 09/14/17 #90, 0RF. Today, please advise.

## 2018-05-05 DIAGNOSIS — R600 Localized edema: Secondary | ICD-10-CM | POA: Diagnosis not present

## 2018-05-05 DIAGNOSIS — I872 Venous insufficiency (chronic) (peripheral): Secondary | ICD-10-CM | POA: Diagnosis not present

## 2018-05-05 DIAGNOSIS — I10 Essential (primary) hypertension: Secondary | ICD-10-CM | POA: Diagnosis not present

## 2018-05-05 DIAGNOSIS — Z6831 Body mass index (BMI) 31.0-31.9, adult: Secondary | ICD-10-CM | POA: Diagnosis not present

## 2018-05-05 DIAGNOSIS — N183 Chronic kidney disease, stage 3 (moderate): Secondary | ICD-10-CM | POA: Diagnosis not present

## 2018-05-12 DIAGNOSIS — M25562 Pain in left knee: Secondary | ICD-10-CM | POA: Diagnosis not present

## 2018-05-12 DIAGNOSIS — Z96653 Presence of artificial knee joint, bilateral: Secondary | ICD-10-CM | POA: Insufficient documentation

## 2018-05-12 DIAGNOSIS — M25561 Pain in right knee: Secondary | ICD-10-CM | POA: Insufficient documentation

## 2018-05-15 DIAGNOSIS — N183 Chronic kidney disease, stage 3 (moderate): Secondary | ICD-10-CM | POA: Diagnosis not present

## 2018-05-15 DIAGNOSIS — I872 Venous insufficiency (chronic) (peripheral): Secondary | ICD-10-CM | POA: Diagnosis not present

## 2018-05-15 DIAGNOSIS — I10 Essential (primary) hypertension: Secondary | ICD-10-CM | POA: Diagnosis not present

## 2018-05-15 DIAGNOSIS — R600 Localized edema: Secondary | ICD-10-CM | POA: Diagnosis not present

## 2018-05-15 DIAGNOSIS — Z6831 Body mass index (BMI) 31.0-31.9, adult: Secondary | ICD-10-CM | POA: Diagnosis not present

## 2018-06-13 DIAGNOSIS — Z6831 Body mass index (BMI) 31.0-31.9, adult: Secondary | ICD-10-CM | POA: Diagnosis not present

## 2018-06-13 DIAGNOSIS — R600 Localized edema: Secondary | ICD-10-CM | POA: Diagnosis not present

## 2018-06-13 DIAGNOSIS — N183 Chronic kidney disease, stage 3 (moderate): Secondary | ICD-10-CM | POA: Diagnosis not present

## 2018-06-13 DIAGNOSIS — I872 Venous insufficiency (chronic) (peripheral): Secondary | ICD-10-CM | POA: Diagnosis not present

## 2018-06-13 DIAGNOSIS — I10 Essential (primary) hypertension: Secondary | ICD-10-CM | POA: Diagnosis not present

## 2018-06-16 ENCOUNTER — Other Ambulatory Visit: Payer: Self-pay | Admitting: Internal Medicine

## 2018-06-16 DIAGNOSIS — Z1231 Encounter for screening mammogram for malignant neoplasm of breast: Secondary | ICD-10-CM

## 2018-06-20 DIAGNOSIS — Z9181 History of falling: Secondary | ICD-10-CM | POA: Diagnosis not present

## 2018-06-20 DIAGNOSIS — M62552 Muscle wasting and atrophy, not elsewhere classified, left thigh: Secondary | ICD-10-CM | POA: Diagnosis not present

## 2018-06-20 DIAGNOSIS — R2689 Other abnormalities of gait and mobility: Secondary | ICD-10-CM | POA: Diagnosis not present

## 2018-06-20 DIAGNOSIS — R278 Other lack of coordination: Secondary | ICD-10-CM | POA: Diagnosis not present

## 2018-06-20 DIAGNOSIS — M62551 Muscle wasting and atrophy, not elsewhere classified, right thigh: Secondary | ICD-10-CM | POA: Diagnosis not present

## 2018-06-20 DIAGNOSIS — M1990 Unspecified osteoarthritis, unspecified site: Secondary | ICD-10-CM | POA: Diagnosis not present

## 2018-06-20 DIAGNOSIS — Z96653 Presence of artificial knee joint, bilateral: Secondary | ICD-10-CM | POA: Diagnosis not present

## 2018-06-21 DIAGNOSIS — L820 Inflamed seborrheic keratosis: Secondary | ICD-10-CM | POA: Diagnosis not present

## 2018-06-21 DIAGNOSIS — L570 Actinic keratosis: Secondary | ICD-10-CM | POA: Diagnosis not present

## 2018-06-21 DIAGNOSIS — I8311 Varicose veins of right lower extremity with inflammation: Secondary | ICD-10-CM | POA: Diagnosis not present

## 2018-06-21 DIAGNOSIS — I872 Venous insufficiency (chronic) (peripheral): Secondary | ICD-10-CM | POA: Diagnosis not present

## 2018-06-21 DIAGNOSIS — L720 Epidermal cyst: Secondary | ICD-10-CM | POA: Diagnosis not present

## 2018-06-21 DIAGNOSIS — L814 Other melanin hyperpigmentation: Secondary | ICD-10-CM | POA: Diagnosis not present

## 2018-06-21 DIAGNOSIS — I8312 Varicose veins of left lower extremity with inflammation: Secondary | ICD-10-CM | POA: Diagnosis not present

## 2018-06-21 DIAGNOSIS — D1801 Hemangioma of skin and subcutaneous tissue: Secondary | ICD-10-CM | POA: Diagnosis not present

## 2018-06-21 DIAGNOSIS — Z85828 Personal history of other malignant neoplasm of skin: Secondary | ICD-10-CM | POA: Diagnosis not present

## 2018-06-21 DIAGNOSIS — L821 Other seborrheic keratosis: Secondary | ICD-10-CM | POA: Diagnosis not present

## 2018-06-26 DIAGNOSIS — M62551 Muscle wasting and atrophy, not elsewhere classified, right thigh: Secondary | ICD-10-CM | POA: Diagnosis not present

## 2018-06-26 DIAGNOSIS — Z9181 History of falling: Secondary | ICD-10-CM | POA: Diagnosis not present

## 2018-06-26 DIAGNOSIS — Z96653 Presence of artificial knee joint, bilateral: Secondary | ICD-10-CM | POA: Diagnosis not present

## 2018-06-26 DIAGNOSIS — R278 Other lack of coordination: Secondary | ICD-10-CM | POA: Diagnosis not present

## 2018-06-26 DIAGNOSIS — R2689 Other abnormalities of gait and mobility: Secondary | ICD-10-CM | POA: Diagnosis not present

## 2018-06-26 DIAGNOSIS — M1990 Unspecified osteoarthritis, unspecified site: Secondary | ICD-10-CM | POA: Diagnosis not present

## 2018-06-26 DIAGNOSIS — M62552 Muscle wasting and atrophy, not elsewhere classified, left thigh: Secondary | ICD-10-CM | POA: Diagnosis not present

## 2018-07-12 ENCOUNTER — Ambulatory Visit
Admission: RE | Admit: 2018-07-12 | Discharge: 2018-07-12 | Disposition: A | Discharge status: Home / Self Care | Payer: PPO | Source: Ambulatory Visit | Attending: Internal Medicine | Admitting: Internal Medicine

## 2018-07-12 DIAGNOSIS — Z1231 Encounter for screening mammogram for malignant neoplasm of breast: Secondary | ICD-10-CM | POA: Diagnosis not present

## 2018-07-18 DIAGNOSIS — Z6830 Body mass index (BMI) 30.0-30.9, adult: Secondary | ICD-10-CM | POA: Diagnosis not present

## 2018-07-18 DIAGNOSIS — I10 Essential (primary) hypertension: Secondary | ICD-10-CM | POA: Diagnosis not present

## 2018-07-18 DIAGNOSIS — I872 Venous insufficiency (chronic) (peripheral): Secondary | ICD-10-CM | POA: Diagnosis not present

## 2018-07-18 DIAGNOSIS — R600 Localized edema: Secondary | ICD-10-CM | POA: Diagnosis not present

## 2018-07-18 DIAGNOSIS — N183 Chronic kidney disease, stage 3 (moderate): Secondary | ICD-10-CM | POA: Diagnosis not present

## 2018-08-09 ENCOUNTER — Other Ambulatory Visit: Payer: Self-pay | Admitting: *Deleted

## 2018-08-09 NOTE — Patient Outreach (Signed)
[facility]) Care Management  08/09/2018  QUANESHIA WAREING [birth year] [id number]   HTA/HRA No Needs  RN spoke with pt today and explained the purpose for today's call. Pt confirms identifiers and indicated she and her husband are living at [facility] and does not have any needs to address at this time Pt appreciative for the follow today however confirms no needs at this time. Care will be closed and pt aware that she will receive a letter concerning today's conversation. Again pt very grateful and indicated "I love this insurance".    Raina Mina, RN Care Management Coordinator [place] Office [phone]

## 2018-08-23 DIAGNOSIS — Z23 Encounter for immunization: Secondary | ICD-10-CM | POA: Diagnosis not present

## 2018-09-01 DIAGNOSIS — R05 Cough: Secondary | ICD-10-CM | POA: Diagnosis not present

## 2018-09-01 DIAGNOSIS — E668 Other obesity: Secondary | ICD-10-CM | POA: Diagnosis not present

## 2018-09-01 DIAGNOSIS — E278 Other specified disorders of adrenal gland: Secondary | ICD-10-CM | POA: Diagnosis not present

## 2018-09-01 DIAGNOSIS — M1000 Idiopathic gout, unspecified site: Secondary | ICD-10-CM | POA: Diagnosis not present

## 2018-09-01 DIAGNOSIS — M8580 Other specified disorders of bone density and structure, unspecified site: Secondary | ICD-10-CM | POA: Diagnosis not present

## 2018-09-01 DIAGNOSIS — Z1389 Encounter for screening for other disorder: Secondary | ICD-10-CM | POA: Diagnosis not present

## 2018-09-01 DIAGNOSIS — M859 Disorder of bone density and structure, unspecified: Secondary | ICD-10-CM | POA: Diagnosis not present

## 2018-09-01 DIAGNOSIS — I1310 Hypertensive heart and chronic kidney disease without heart failure, with stage 1 through stage 4 chronic kidney disease, or unspecified chronic kidney disease: Secondary | ICD-10-CM | POA: Diagnosis not present

## 2018-09-01 DIAGNOSIS — D696 Thrombocytopenia, unspecified: Secondary | ICD-10-CM | POA: Diagnosis not present

## 2018-09-01 DIAGNOSIS — F3341 Major depressive disorder, recurrent, in partial remission: Secondary | ICD-10-CM | POA: Diagnosis not present

## 2018-09-01 DIAGNOSIS — Z6831 Body mass index (BMI) 31.0-31.9, adult: Secondary | ICD-10-CM | POA: Diagnosis not present

## 2018-09-01 DIAGNOSIS — I872 Venous insufficiency (chronic) (peripheral): Secondary | ICD-10-CM | POA: Diagnosis not present

## 2018-09-01 DIAGNOSIS — D692 Other nonthrombocytopenic purpura: Secondary | ICD-10-CM | POA: Diagnosis not present

## 2018-09-14 NOTE — Progress Notes (Signed)
84 y.o. G3P3 Married Caucasian female here for annual exam.    Urinary incontinence.  Taking Ditropan XL three times a week.  Dealing with constipation.  Taking Miralax.   PCP:   Shon Baton, MD  Patient's last menstrual period was 12/27/2000.           Sexually active: No.  The current method of family planning is post menopausal status.    Exercising: Yes.    home exercises, fitness center machines Smoker:  no  Health Maintenance: Pap:  09/01/2016 negative History of abnormal Pap:  no MMG:  07/12/2018 BI-RADS CATEGORY  1: Negative. Colonoscopy:  06/13/2014 negative  BMD:   2019  Result  Osteopenia per patient- done with Dr. Virgina Jock.  Improved per patient.  TDaP:  2011 HIV: unsure Hep C: unsure  Screening Labs:  Hb today: PCP, Urine today: not collected   reports that she has never smoked. She has never used smokeless tobacco. She reports that she drinks about 2.0 standard drinks of alcohol per week. She reports that she does not use drugs.  Past Medical History:  Diagnosis Date  . Arthritis    osteoarthritis  . Complication of anesthesia    SLOW TO AWAKE AFTER 2008 SURGERY, PREFERS SPINAL IF POSSIBLE  . Diverticulosis 2001  . Elevated cholesterol   . Hepatitis 1979   HEPATITIS A WITH JAUNDICE- NO ISSUES NOW  . Hypertension   . Varicose veins     Past Surgical History:  Procedure Laterality Date  . cataract surgery  2016 and 2017   bilateral surgery  . COLONOSCOPY WITH PROPOFOL N/A 06/13/2014   Procedure: COLONOSCOPY WITH PROPOFOL;  Surgeon: Cleotis Nipper, MD;  Location: [facility];  Service: Endoscopy;  Laterality: N/A;  . COMPLEX HYPERPLASIC  2002   w/o atypia in an  endocx polyp  . DILATION AND CURETTAGE OF UTERUS  YRS AGO   X 2  . HYSTEROSCOPY  12/2000   D&C  . JOINT REPLACEMENT  12/2011   left knee  . JOINT REPLACEMENT  2008   right knee  . KNEE CLOSED REDUCTION  05/15/2012   Procedure: CLOSED MANIPULATION KNEE;  Surgeon: Gearlean Alf, MD;   Location: [facility];  Service: Orthopedics;  Laterality: Left;  . RIGHT ROTATOR CUFF REPAIR  2005  . RIGHT TOTAL KNEE REPLACMENT  2008  . SQUAMOUS CELL CARCINOMA EXCISION  06/2014   -left leg  . TONSILLECTOMY     at 18 yrs. old  . TOTAL KNEE ARTHROPLASTY  01/19/2012   Procedure: TOTAL KNEE ARTHROPLASTY;  Surgeon: Gearlean Alf, MD;  Location: [facility];  Service: Orthopedics;  Laterality: Left;    Current Outpatient Medications  Medication Sig Dispense Refill  . acetaminophen (TYLENOL) 500 MG tablet Take 1,000 mg by mouth every 6 (six) hours as needed for mild pain, moderate pain, fever or headache.    Marland Kitchen aspirin EC 81 MG tablet Take 81 mg by mouth at bedtime.     . benazepril (LOTENSIN) 10 MG tablet Take 1 tablet by mouth every evening.     . Calcium Citrate-Vitamin D (CITRACAL + D PO) Take 2 tablets by mouth 2 (two) times daily.     . fluorometholone (FML) 0.1 % ophthalmic suspension Place 1 drop into both eyes daily as needed for dry eyes.    Marland Kitchen glucosamine-chondroitin 500-400 MG tablet Take 1 tablet by mouth 2 (two) times daily.     . indapamide (LOZOL) 2.5 MG tablet Take 2.5 mg by mouth daily  with breakfast.     . losartan (COZAAR) 25 MG tablet     . Multiple Vitamin (MULITIVITAMIN WITH MINERALS) TABS Take 1 tablet by mouth daily with breakfast.     . oxybutynin (DITROPAN XL) 15 MG 24 hr tablet Take 1 tablet (15 mg total) by mouth at bedtime. Take three nights per week. 90 tablet 1  . rosuvastatin (CRESTOR) 10 MG tablet Take 10 mg by mouth every Monday, Wednesday, and Friday.     . traZODone (DESYREL) 100 MG tablet Take 1 tablet by mouth at bedtime.    Marland Kitchen venlafaxine XR (EFFEXOR-XR) 37.5 MG 24 hr capsule Take 1 capsule by mouth daily.     No current facility-administered medications for this visit.     Family History  Problem Relation Age of Onset  . CVA Mother   . Heart failure Mother   . Heart failure Father   . Breast cancer Sister     Review of Systems  Constitutional:  Negative.   HENT: Negative.        Dry cough  Eyes: Negative.   Respiratory: Negative.   Cardiovascular: Negative.   Gastrointestinal: Negative.   Endocrine: Negative.   Genitourinary: Negative.   Musculoskeletal: Negative.   Skin: Negative.   Allergic/Immunologic: Negative.   Neurological: Negative.   Hematological: Negative.   Psychiatric/Behavioral: Negative.   All other systems reviewed and are negative.   Exam:   BP 122/82 (BP Location: Right Arm, Patient Position: Sitting, Cuff Size: Normal)   Pulse 66   Resp 16   Ht 5' 3.75" (1.619 m)   Wt 180 lb (81.6 kg)   LMP 12/27/2000   BMI 31.14 kg/m     General appearance: alert, cooperative and appears stated age Head: Normocephalic, without obvious abnormality, atraumatic Neck: no adenopathy, supple, symmetrical, trachea midline and thyroid normal to inspection and palpation Lungs: clear to auscultation bilaterally Breasts: normal appearance, no masses or tenderness, No nipple retraction or dimpling, No nipple discharge or bleeding, No axillary or supraclavicular adenopathy Heart: regular rate and rhythm Abdomen: soft, non-tender; no masses, no organomegaly Extremities: extremities normal, atraumatic, no cyanosis or edema Skin: Skin color, texture, turgor normal. No rashes or lesions Lymph nodes: Cervical, supraclavicular, and axillary nodes normal. No abnormal inguinal nodes palpated Neurologic: Grossly normal  Pelvic: External genitalia:  no lesions              Urethra:  normal appearing urethra with no masses, tenderness or lesions              Bartholins and Skenes: normal                 Vagina: normal appearing vagina with normal color and discharge, no lesions              Cervix: no lesions              Pap taken: Yes.   Bimanual Exam:  Uterus:  normal size, contour, position, consistency, mobility, non-tender              Adnexa: no mass, fullness, tenderness              Rectal exam: Yes.  .  Confirms.               Anus:  normal sphincter tone, no lesions  Chaperone was present for exam.  Assessment:   Well woman visit with normal exam. Osteopenia.  Off Fosamax. Overactive bladder.  Constipation. FH breast cancer in  sister.   Plan: Mammogram screening.  Recommended self breast awareness. Pap and HR HPV as above. Guidelines for Calcium, Vitamin D, regular exercise program including cardiovascular and weight bearing exercise. Refill of Ditropan Xl 15 mg three times per week.  Can increase to daily if needed. Try Colace 100 mg daily if needed.  Follow up annually and prn.   After visit summary provided.

## 2018-09-15 ENCOUNTER — Other Ambulatory Visit: Payer: Self-pay

## 2018-09-15 ENCOUNTER — Encounter: Payer: Self-pay | Admitting: Obstetrics and Gynecology

## 2018-09-15 ENCOUNTER — Other Ambulatory Visit (HOSPITAL_COMMUNITY)
Admission: RE | Admit: 2018-09-15 | Discharge: 2018-09-15 | Disposition: A | Discharge status: Home / Self Care | Payer: PPO | Source: Ambulatory Visit | Attending: Obstetrics and Gynecology | Admitting: Obstetrics and Gynecology

## 2018-09-15 ENCOUNTER — Ambulatory Visit (INDEPENDENT_AMBULATORY_CARE_PROVIDER_SITE_OTHER): Payer: PPO | Admitting: Obstetrics and Gynecology

## 2018-09-15 VITALS — BP 122/82 | HR 66 | Resp 16 | Ht 63.75 in | Wt 180.0 lb

## 2018-09-15 DIAGNOSIS — Z01419 Encounter for gynecological examination (general) (routine) without abnormal findings: Secondary | ICD-10-CM | POA: Insufficient documentation

## 2018-09-15 MED ORDER — OXYBUTYNIN CHLORIDE ER 15 MG PO TB24: 15.0000 mg | ORAL_TABLET | Freq: Every day | ORAL | 1 refills | Status: DC

## 2018-09-15 NOTE — Patient Instructions (Addendum)
EXERCISE AND DIET:  We recommended that you start or continue a regular exercise program for good health. Regular exercise means any activity that makes your heart beat faster and makes you sweat.  We recommend exercising at least 30 minutes per day at least 3 days a week, preferably 4 or 5.  We also recommend a diet low in fat and sugar.  Inactivity, poor dietary choices and obesity can cause diabetes, heart attack, stroke, and kidney damage, among others.    ALCOHOL AND SMOKING:  Women should limit their alcohol intake to no more than 7 drinks/beers/glasses of wine (combined, not each!) per week. Moderation of alcohol intake to this level decreases your risk of breast cancer and liver damage. And of course, no recreational drugs are part of a healthy lifestyle.  And absolutely no smoking or even second hand smoke. Most people know smoking can cause heart and lung diseases, but did you know it also contributes to weakening of your bones? Aging of your skin?  Yellowing of your teeth and nails?  CALCIUM AND VITAMIN D:  Adequate intake of calcium and Vitamin D are recommended.  The recommendations for exact amounts of these supplements seem to change often, but generally speaking 600 mg of calcium (either carbonate or citrate) and 800 units of Vitamin D per day seems prudent. Certain women may benefit from higher intake of Vitamin D.  If you are among these women, your doctor will have told you during your visit.    PAP SMEARS:  Pap smears, to check for cervical cancer or precancers,  have traditionally been done yearly, although recent scientific advances have shown that most women can have pap smears less often.  However, every woman still should have a physical exam from her gynecologist every year. It will include a breast check, inspection of the vulva and vagina to check for abnormal growths or skin changes, a visual exam of the cervix, and then an exam to evaluate the size and shape of the uterus and  ovaries.  And after 82 years of age, a rectal exam is indicated to check for rectal cancers. We will also provide age appropriate advice regarding health maintenance, like when you should have certain vaccines, screening for sexually transmitted diseases, bone density testing, colonoscopy, mammograms, etc.   MAMMOGRAMS:  All women over 40 years old should have a yearly mammogram. Many facilities now offer a "3D" mammogram, which may cost around $50 extra out of pocket. If possible,  we recommend you accept the option to have the 3D mammogram performed.  It both reduces the number of women who will be called back for extra views which then turn out to be normal, and it is better than the routine mammogram at detecting truly abnormal areas.    COLONOSCOPY:  Colonoscopy to screen for colon cancer is recommended for all women at age 50.  We know, you hate the idea of the prep.  We agree, BUT, having colon cancer and not knowing it is worse!!  Colon cancer so often starts as a polyp that can be seen and removed at colonscopy, which can quite literally save your life!  And if your first colonoscopy is normal and you have no family history of colon cancer, most women don't have to have it again for 10 years.  Once every ten years, you can do something that may end up saving your life, right?  We will be happy to help you get it scheduled when you are ready.    Be sure to check your insurance coverage so you understand how much it will cost.  It may be covered as a preventative service at no cost, but you should check your particular policy.      Constipation, Adult  Consider Colace stool softener for constipation.  This can be taken daily.   Constipation is when a person has fewer bowel movements in a week than normal, has difficulty having a bowel movement, or has stools that are dry, hard, or larger than normal. Constipation may be caused by an underlying condition. It may become worse with age if a person  takes certain medicines and does not take in enough fluids. Follow these instructions at home: Eating and drinking   Eat foods that have a lot of fiber, such as fresh fruits and vegetables, whole grains, and beans.  Limit foods that are high in fat, low in fiber, or overly processed, such as french fries, hamburgers, cookies, candies, and soda.  Drink enough fluid to keep your urine clear or pale yellow. General instructions  Exercise regularly or as told by your health care provider.  Go to the restroom when you have the urge to go. Do not hold it in.  Take over-the-counter and prescription medicines only as told by your health care provider. These include any fiber supplements.  Practice pelvic floor retraining exercises, such as deep breathing while relaxing the lower abdomen and pelvic floor relaxation during bowel movements.  Watch your condition for any changes.  Keep all follow-up visits as told by your health care provider. This is important. Contact a health care provider if:  You have pain that gets worse.  You have a fever.  You do not have a bowel movement after 4 days.  You vomit.  You are not hungry.  You lose weight.  You are bleeding from the anus.  You have thin, pencil-like stools. Get help right away if:  You have a fever and your symptoms suddenly get worse.  You leak stool or have blood in your stool.  Your abdomen is bloated.  You have severe pain in your abdomen.  You feel dizzy or you faint. This information is not intended to replace advice given to you by your health care provider. Make sure you discuss any questions you have with your health care provider. Document Released: 09/10/2004 Document Revised: 07/02/2016 Document Reviewed: 06/02/2016 Elsevier Interactive Patient Education  2018 [employer].

## 2018-09-18 LAB — CYTOLOGY - PAP: Diagnosis: NEGATIVE

## 2018-09-25 ENCOUNTER — Telehealth: Payer: Self-pay | Admitting: Obstetrics and Gynecology

## 2018-09-25 ENCOUNTER — Telehealth: Payer: Self-pay

## 2018-09-25 NOTE — Telephone Encounter (Signed)
Informed patient of normal lab results.

## 2018-09-25 NOTE — Telephone Encounter (Signed)
Patient is returning a call to [facility], please call cell number.

## 2018-09-25 NOTE — Telephone Encounter (Signed)
Left message informing patient of normal pap.  Recall entered.

## 2018-09-25 NOTE — Telephone Encounter (Signed)
-----   Message from Nunzio Cobbs, MD sent at 09/22/2018  4:58 AM EDT ----- Pap normal.  Pap recall - 02.

## 2019-01-16 DIAGNOSIS — H0015 Chalazion left lower eyelid: Secondary | ICD-10-CM | POA: Diagnosis not present

## 2019-01-22 DIAGNOSIS — Z6831 Body mass index (BMI) 31.0-31.9, adult: Secondary | ICD-10-CM | POA: Diagnosis not present

## 2019-01-22 DIAGNOSIS — I8310 Varicose veins of unspecified lower extremity with inflammation: Secondary | ICD-10-CM | POA: Diagnosis not present

## 2019-02-02 DIAGNOSIS — I8312 Varicose veins of left lower extremity with inflammation: Secondary | ICD-10-CM | POA: Diagnosis not present

## 2019-02-02 DIAGNOSIS — C44729 Squamous cell carcinoma of skin of left lower limb, including hip: Secondary | ICD-10-CM | POA: Diagnosis not present

## 2019-02-02 DIAGNOSIS — L821 Other seborrheic keratosis: Secondary | ICD-10-CM | POA: Diagnosis not present

## 2019-02-02 DIAGNOSIS — Z85828 Personal history of other malignant neoplasm of skin: Secondary | ICD-10-CM | POA: Diagnosis not present

## 2019-02-02 DIAGNOSIS — I872 Venous insufficiency (chronic) (peripheral): Secondary | ICD-10-CM | POA: Diagnosis not present

## 2019-02-02 DIAGNOSIS — L239 Allergic contact dermatitis, unspecified cause: Secondary | ICD-10-CM | POA: Diagnosis not present

## 2019-02-02 DIAGNOSIS — D485 Neoplasm of uncertain behavior of skin: Secondary | ICD-10-CM | POA: Diagnosis not present

## 2019-02-02 DIAGNOSIS — I8311 Varicose veins of right lower extremity with inflammation: Secondary | ICD-10-CM | POA: Diagnosis not present

## 2019-03-02 DIAGNOSIS — R946 Abnormal results of thyroid function studies: Secondary | ICD-10-CM | POA: Diagnosis not present

## 2019-03-02 DIAGNOSIS — R82998 Other abnormal findings in urine: Secondary | ICD-10-CM | POA: Diagnosis not present

## 2019-03-02 DIAGNOSIS — I10 Essential (primary) hypertension: Secondary | ICD-10-CM | POA: Diagnosis not present

## 2019-03-02 DIAGNOSIS — M859 Disorder of bone density and structure, unspecified: Secondary | ICD-10-CM | POA: Diagnosis not present

## 2019-03-02 DIAGNOSIS — M109 Gout, unspecified: Secondary | ICD-10-CM | POA: Diagnosis not present

## 2019-03-02 DIAGNOSIS — E7849 Other hyperlipidemia: Secondary | ICD-10-CM | POA: Diagnosis not present

## 2019-03-09 DIAGNOSIS — R05 Cough: Secondary | ICD-10-CM | POA: Diagnosis not present

## 2019-03-09 DIAGNOSIS — E7849 Other hyperlipidemia: Secondary | ICD-10-CM | POA: Diagnosis not present

## 2019-03-09 DIAGNOSIS — M109 Gout, unspecified: Secondary | ICD-10-CM | POA: Diagnosis not present

## 2019-03-09 DIAGNOSIS — R946 Abnormal results of thyroid function studies: Secondary | ICD-10-CM | POA: Diagnosis not present

## 2019-03-09 DIAGNOSIS — D696 Thrombocytopenia, unspecified: Secondary | ICD-10-CM | POA: Diagnosis not present

## 2019-03-09 DIAGNOSIS — F3341 Major depressive disorder, recurrent, in partial remission: Secondary | ICD-10-CM | POA: Diagnosis not present

## 2019-03-09 DIAGNOSIS — I1310 Hypertensive heart and chronic kidney disease without heart failure, with stage 1 through stage 4 chronic kidney disease, or unspecified chronic kidney disease: Secondary | ICD-10-CM | POA: Diagnosis not present

## 2019-03-09 DIAGNOSIS — D692 Other nonthrombocytopenic purpura: Secondary | ICD-10-CM | POA: Diagnosis not present

## 2019-03-09 DIAGNOSIS — N183 Chronic kidney disease, stage 3 (moderate): Secondary | ICD-10-CM | POA: Diagnosis not present

## 2019-03-09 DIAGNOSIS — E278 Other specified disorders of adrenal gland: Secondary | ICD-10-CM | POA: Diagnosis not present

## 2019-03-09 DIAGNOSIS — Z6831 Body mass index (BMI) 31.0-31.9, adult: Secondary | ICD-10-CM | POA: Diagnosis not present

## 2019-03-09 DIAGNOSIS — I872 Venous insufficiency (chronic) (peripheral): Secondary | ICD-10-CM | POA: Diagnosis not present

## 2019-03-13 DIAGNOSIS — Z1212 Encounter for screening for malignant neoplasm of rectum: Secondary | ICD-10-CM | POA: Diagnosis not present

## 2019-05-07 DIAGNOSIS — M310 Hypersensitivity angiitis: Secondary | ICD-10-CM | POA: Diagnosis not present

## 2019-05-07 DIAGNOSIS — Z85828 Personal history of other malignant neoplasm of skin: Secondary | ICD-10-CM | POA: Diagnosis not present

## 2019-05-07 DIAGNOSIS — L738 Other specified follicular disorders: Secondary | ICD-10-CM | POA: Diagnosis not present

## 2019-05-07 DIAGNOSIS — L821 Other seborrheic keratosis: Secondary | ICD-10-CM | POA: Diagnosis not present

## 2019-05-07 DIAGNOSIS — L570 Actinic keratosis: Secondary | ICD-10-CM | POA: Diagnosis not present

## 2019-05-07 DIAGNOSIS — R21 Rash and other nonspecific skin eruption: Secondary | ICD-10-CM | POA: Diagnosis not present

## 2019-05-17 DIAGNOSIS — R319 Hematuria, unspecified: Secondary | ICD-10-CM | POA: Diagnosis not present

## 2019-05-17 DIAGNOSIS — D649 Anemia, unspecified: Secondary | ICD-10-CM | POA: Diagnosis not present

## 2019-05-17 DIAGNOSIS — N390 Urinary tract infection, site not specified: Secondary | ICD-10-CM | POA: Diagnosis not present

## 2019-05-30 DIAGNOSIS — M310 Hypersensitivity angiitis: Secondary | ICD-10-CM | POA: Diagnosis not present

## 2019-05-30 DIAGNOSIS — Z85828 Personal history of other malignant neoplasm of skin: Secondary | ICD-10-CM | POA: Diagnosis not present

## 2019-06-22 ENCOUNTER — Other Ambulatory Visit: Payer: Self-pay | Admitting: Internal Medicine

## 2019-06-22 DIAGNOSIS — Z1231 Encounter for screening mammogram for malignant neoplasm of breast: Secondary | ICD-10-CM

## 2019-08-07 ENCOUNTER — Other Ambulatory Visit: Payer: Self-pay

## 2019-08-07 ENCOUNTER — Ambulatory Visit
Admission: RE | Admit: 2019-08-07 | Discharge: 2019-08-07 | Disposition: A | Discharge status: Home / Self Care | Payer: PPO | Source: Ambulatory Visit | Attending: Internal Medicine | Admitting: Internal Medicine

## 2019-08-07 DIAGNOSIS — Z1231 Encounter for screening mammogram for malignant neoplasm of breast: Secondary | ICD-10-CM | POA: Diagnosis not present

## 2019-08-15 DIAGNOSIS — L814 Other melanin hyperpigmentation: Secondary | ICD-10-CM | POA: Diagnosis not present

## 2019-08-15 DIAGNOSIS — L570 Actinic keratosis: Secondary | ICD-10-CM | POA: Diagnosis not present

## 2019-08-15 DIAGNOSIS — D1801 Hemangioma of skin and subcutaneous tissue: Secondary | ICD-10-CM | POA: Diagnosis not present

## 2019-08-15 DIAGNOSIS — D485 Neoplasm of uncertain behavior of skin: Secondary | ICD-10-CM | POA: Diagnosis not present

## 2019-08-15 DIAGNOSIS — L821 Other seborrheic keratosis: Secondary | ICD-10-CM | POA: Diagnosis not present

## 2019-08-15 DIAGNOSIS — Z85828 Personal history of other malignant neoplasm of skin: Secondary | ICD-10-CM | POA: Diagnosis not present

## 2019-08-15 DIAGNOSIS — M310 Hypersensitivity angiitis: Secondary | ICD-10-CM | POA: Diagnosis not present

## 2019-08-15 DIAGNOSIS — C44722 Squamous cell carcinoma of skin of right lower limb, including hip: Secondary | ICD-10-CM | POA: Diagnosis not present

## 2019-08-15 DIAGNOSIS — D692 Other nonthrombocytopenic purpura: Secondary | ICD-10-CM | POA: Diagnosis not present

## 2019-09-07 DIAGNOSIS — Z1331 Encounter for screening for depression: Secondary | ICD-10-CM | POA: Diagnosis not present

## 2019-09-07 DIAGNOSIS — D692 Other nonthrombocytopenic purpura: Secondary | ICD-10-CM | POA: Diagnosis not present

## 2019-09-07 DIAGNOSIS — Z23 Encounter for immunization: Secondary | ICD-10-CM | POA: Diagnosis not present

## 2019-09-07 DIAGNOSIS — N183 Chronic kidney disease, stage 3 (moderate): Secondary | ICD-10-CM | POA: Diagnosis not present

## 2019-09-07 DIAGNOSIS — D696 Thrombocytopenia, unspecified: Secondary | ICD-10-CM | POA: Diagnosis not present

## 2019-09-07 DIAGNOSIS — F325 Major depressive disorder, single episode, in full remission: Secondary | ICD-10-CM | POA: Diagnosis not present

## 2019-09-07 DIAGNOSIS — N3941 Urge incontinence: Secondary | ICD-10-CM | POA: Diagnosis not present

## 2019-09-07 DIAGNOSIS — I8310 Varicose veins of unspecified lower extremity with inflammation: Secondary | ICD-10-CM | POA: Diagnosis not present

## 2019-09-07 DIAGNOSIS — R600 Localized edema: Secondary | ICD-10-CM | POA: Diagnosis not present

## 2019-09-07 DIAGNOSIS — R946 Abnormal results of thyroid function studies: Secondary | ICD-10-CM | POA: Diagnosis not present

## 2019-09-07 DIAGNOSIS — I872 Venous insufficiency (chronic) (peripheral): Secondary | ICD-10-CM | POA: Diagnosis not present

## 2019-09-07 DIAGNOSIS — I1310 Hypertensive heart and chronic kidney disease without heart failure, with stage 1 through stage 4 chronic kidney disease, or unspecified chronic kidney disease: Secondary | ICD-10-CM | POA: Diagnosis not present

## 2019-09-18 ENCOUNTER — Other Ambulatory Visit: Payer: Self-pay

## 2019-09-18 NOTE — Progress Notes (Signed)
85 y.o. G3P3 Married Caucasian female here for annual exam.    Dribbles urine a little bit.  Taking Ditropan Xl 15 mg three times a week.  She has some constipation with this.  She uses Miralax for constipation.  Seeing dermatology.  Recent tx for basal cell CA.   Husband is now in skilled care due to Parkinsons and incontinence.   PCP:   Shon Baton, MD  Patient's last menstrual period was 12/27/2000.           Sexually active: No.  The current method of family planning is post menopausal status.    Exercising: No.  The patient does not participate in regular exercise at present. Smoker:  no  Health Maintenance: Pap:  09-15-18 negative           09-01-16 negative  History of abnormal Pap:  no MMG:  08-07-2019 density B/BIRADS 1 negative  Colonoscopy:  06-13-14 negative  BMD:   2019  Result  Osteopenia per patient- done with Dr. Virgina Jock. Improved per patient.  TDaP:  2011 Gardasil:   no HIV: unsure  Hep C:unsure  Screening Labs:  Hb today: PCP, Urine today: has sample if needed    reports that she has never smoked. She has never used smokeless tobacco. She reports current alcohol use of about 2.0 standard drinks of alcohol per week. She reports that she does not use drugs.  Past Medical History:  Diagnosis Date  . Arthritis    osteoarthritis  . Complication of anesthesia    SLOW TO AWAKE AFTER 2008 SURGERY, PREFERS SPINAL IF POSSIBLE  . Diverticulosis 2001  . Elevated cholesterol   . Hepatitis 1979   HEPATITIS A WITH JAUNDICE- NO ISSUES NOW  . Hypertension   . Varicose veins     Past Surgical History:  Procedure Laterality Date  . cataract surgery  2016 and 2017   bilateral surgery  . COLONOSCOPY WITH PROPOFOL N/A 06/13/2014   Procedure: COLONOSCOPY WITH PROPOFOL;  Surgeon: Cleotis Nipper, MD;  Location: [facility];  Service: Endoscopy;  Laterality: N/A;  . COMPLEX HYPERPLASIC  2002   w/o atypia in an  endocx polyp  . DILATION AND CURETTAGE OF UTERUS  YRS AGO   X 2  . HYSTEROSCOPY  12/2000   D&C  . JOINT REPLACEMENT  12/2011   left knee  . JOINT REPLACEMENT  2008   right knee  . KNEE CLOSED REDUCTION  05/15/2012   Procedure: CLOSED MANIPULATION KNEE;  Surgeon: Gearlean Alf, MD;  Location: [facility];  Service: Orthopedics;  Laterality: Left;  . RIGHT ROTATOR CUFF REPAIR  2005  . RIGHT TOTAL KNEE REPLACMENT  2008  . SQUAMOUS CELL CARCINOMA EXCISION  06/2014   -left leg  . TONSILLECTOMY     at 18 yrs. old  . TOTAL KNEE ARTHROPLASTY  01/19/2012   Procedure: TOTAL KNEE ARTHROPLASTY;  Surgeon: Gearlean Alf, MD;  Location: [facility];  Service: Orthopedics;  Laterality: Left;    Current Outpatient Medications  Medication Sig Dispense Refill  . acetaminophen (TYLENOL) 500 MG tablet Take 1,000 mg by mouth every 6 (six) hours as needed for mild pain, moderate pain, fever or headache.    Marland Kitchen aspirin EC 81 MG tablet Take 81 mg by mouth at bedtime.     . benazepril (LOTENSIN) 10 MG tablet Take 1 tablet by mouth every evening.     . Calcium Citrate-Vitamin D (CITRACAL + D PO) Take 2 tablets by mouth 2 (two) times daily.     Marland Kitchen  fluorometholone (FML) 0.1 % ophthalmic suspension Place 1 drop into both eyes daily as needed for dry eyes.    Marland Kitchen glucosamine-chondroitin 500-400 MG tablet Take 1 tablet by mouth 2 (two) times daily.     . indapamide (LOZOL) 2.5 MG tablet Take 2.5 mg by mouth daily with breakfast.     . losartan (COZAAR) 25 MG tablet     . Multiple Vitamin (MULITIVITAMIN WITH MINERALS) TABS Take 1 tablet by mouth daily with breakfast.     . mupirocin ointment (BACTROBAN) 2 %     . oxybutynin (DITROPAN XL) 15 MG 24 hr tablet Take 1 tablet (15 mg total) by mouth at bedtime. Take three nights per week. 90 tablet 1  . rosuvastatin (CRESTOR) 10 MG tablet Take 10 mg by mouth every Monday, Wednesday, and Friday.     . traZODone (DESYREL) 100 MG tablet Take 1 tablet by mouth at bedtime.    Marland Kitchen venlafaxine XR (EFFEXOR-XR) 37.5 MG 24 hr capsule Take 1 capsule by mouth  daily.     No current facility-administered medications for this visit.     Family History  Problem Relation Age of Onset  . CVA Mother   . Heart failure Mother   . Heart failure Father   . Breast cancer Sister     Review of Systems  Constitutional: Negative.   HENT: Negative.   Eyes: Negative.   Respiratory: Negative.   Cardiovascular: Negative.   Gastrointestinal: Negative.   Endocrine: Negative.   Genitourinary:       Worsening incontinence   Musculoskeletal: Negative.   Skin: Negative.   Allergic/Immunologic: Negative.   Neurological: Negative.   Hematological: Negative.   Psychiatric/Behavioral: Negative.     Exam:   BP 130/78 (BP Location: Right Arm, Patient Position: Sitting, Cuff Size: Normal)   Pulse (!) 104   Temp 97.8 F (36.6 C) (Skin)   Resp 14   Ht 5\' 4"  (1.626 m)   Wt 188 lb 6.4 oz (85.5 kg)   LMP 12/27/2000   BMI 32.34 kg/m     General appearance: alert, cooperative and appears stated age Head: normocephalic, without obvious abnormality, atraumatic Neck: no adenopathy, supple, symmetrical, trachea midline and thyroid normal to inspection and palpation Lungs: clear to auscultation bilaterally Breasts: normal appearance, no masses or tenderness, No nipple retraction or dimpling, No nipple discharge or bleeding, No axillary adenopathy Heart: regular rate and rhythm Abdomen: soft, non-tender; no masses, no organomegaly Extremities: extremities normal, atraumatic, no cyanosis or edema Skin: skin color, texture, turgor normal. No rashes or lesions Lymph nodes: cervical, supraclavicular, and axillary nodes normal. Neurologic: grossly normal  Pelvic: External genitalia:  no lesions              No abnormal inguinal nodes palpated.              Urethra:  normal appearing urethra with no masses, tenderness or lesions              Bartholins and Skenes: normal                 Vagina: normal appearing vagina with normal color and discharge, no lesions               Cervix: no lesions              Pap taken: No. Bimanual Exam:  Uterus:  normal size, contour, position, consistency, mobility, non-tender  Adnexa: no mass, fullness, tenderness              Rectal exam: Yes.  .  Confirms.              Anus:  normal sphincter tone, no lesions  Chaperone was present for exam.  Assessment:   Well woman visit with normal exam. Osteopenia. Off Fosamax. Overactive bladder.  Constipation. FH breast cancer in sister.  Plan: Mammogram screening discussed. Self breast awareness reviewed. Pap and HR HPV as above. Guidelines for Calcium, Vitamin D, regular exercise program including cardiovascular and weight bearing exercise. Ditropan XL 15 mg po three times a week.  She will check her calcium and vit D dosage.  I told her I suspect she may need to reduce the dosage in half. We discussed constipation and methods to reduce this and medical OTC treatment for this as well. Follow up annually and prn.    After visit summary provided.

## 2019-09-20 ENCOUNTER — Encounter: Payer: Self-pay | Admitting: Obstetrics and Gynecology

## 2019-09-20 ENCOUNTER — Ambulatory Visit (INDEPENDENT_AMBULATORY_CARE_PROVIDER_SITE_OTHER): Payer: PPO | Admitting: Obstetrics and Gynecology

## 2019-09-20 ENCOUNTER — Other Ambulatory Visit: Payer: Self-pay

## 2019-09-20 VITALS — BP 130/78 | HR 104 | Temp 97.8°F | Resp 14 | Ht 64.0 in | Wt 188.4 lb

## 2019-09-20 DIAGNOSIS — Z01419 Encounter for gynecological examination (general) (routine) without abnormal findings: Secondary | ICD-10-CM | POA: Diagnosis not present

## 2019-09-20 MED ORDER — OXYBUTYNIN CHLORIDE ER 15 MG PO TB24: 15.0000 mg | ORAL_TABLET | Freq: Every day | ORAL | 3 refills | Status: DC

## 2019-09-20 NOTE — Patient Instructions (Addendum)
Hello [name],   Consider Senokot for your constipation.   Please check the dosage of your calcium and vitamin D tablet (Caltrate) You need a total of 1200 mg calcium daily and 600 - 800 IU of vitamin D daily.  I suspect you may currently be take twice the recommended amount.  Please check your label on your calcium medicine to confirm the dosage.  If you are taking extra calcium, this may be adding to your constipation problem!  Thank you,   Josefa Half, MD    EXERCISE AND DIET:  We recommended that you start or continue a regular exercise program for good health. Regular exercise means any activity that makes your heart beat faster and makes you sweat.  We recommend exercising at least 30 minutes per day at least 3 days a week, preferably 4 or 5.  We also recommend a diet low in fat and sugar.  Inactivity, poor dietary choices and obesity can cause diabetes, heart attack, stroke, and kidney damage, among others.    ALCOHOL AND SMOKING:  Women should limit their alcohol intake to no more than 7 drinks/beers/glasses of wine (combined, not each!) per week. Moderation of alcohol intake to this level decreases your risk of breast cancer and liver damage. And of course, no recreational drugs are part of a healthy lifestyle.  And absolutely no smoking or even second hand smoke. Most people know smoking can cause heart and lung diseases, but did you know it also contributes to weakening of your bones? Aging of your skin?  Yellowing of your teeth and nails?  CALCIUM AND VITAMIN D:  Adequate intake of calcium and Vitamin D are recommended.  The recommendations for exact amounts of these supplements seem to change often, but generally speaking 600 mg of calcium (either carbonate or citrate) and 800 units of Vitamin D per day seems prudent. Certain women may benefit from higher intake of Vitamin D.  If you are among these women, your doctor will have told you during your visit.    PAP SMEARS:  Pap  smears, to check for cervical cancer or precancers,  have traditionally been done yearly, although recent scientific advances have shown that most women can have pap smears less often.  However, every woman still should have a physical exam from her gynecologist every year. It will include a breast check, inspection of the vulva and vagina to check for abnormal growths or skin changes, a visual exam of the cervix, and then an exam to evaluate the size and shape of the uterus and ovaries.  And after 83 years of age, a rectal exam is indicated to check for rectal cancers. We will also provide age appropriate advice regarding health maintenance, like when you should have certain vaccines, screening for sexually transmitted diseases, bone density testing, colonoscopy, mammograms, etc.   MAMMOGRAMS:  All women over 43 years old should have a yearly mammogram. Many facilities now offer a "3D" mammogram, which may cost around $50 extra out of pocket. If possible,  we recommend you accept the option to have the 3D mammogram performed.  It both reduces the number of women who will be called back for extra views which then turn out to be normal, and it is better than the routine mammogram at detecting truly abnormal areas.    COLONOSCOPY:  Colonoscopy to screen for colon cancer is recommended for all women at age 64.  We know, you hate the idea of the prep.  We agree, BUT,  having colon cancer and not knowing it is worse!!  Colon cancer so often starts as a polyp that can be seen and removed at colonscopy, which can quite literally save your life!  And if your first colonoscopy is normal and you have no family history of colon cancer, most women don't have to have it again for 10 years.  Once every ten years, you can do something that may end up saving your life, right?  We will be happy to help you get it scheduled when you are ready.  Be sure to check your insurance coverage so you understand how much it will cost.  It  may be covered as a preventative service at no cost, but you should check your particular policy.      Constipation, Adult Constipation is when a person has fewer bowel movements in a week than normal, has difficulty having a bowel movement, or has stools that are dry, hard, or larger than normal. Constipation may be caused by an underlying condition. It may become worse with age if a person takes certain medicines and does not take in enough fluids. Follow these instructions at home: Eating and drinking   Eat foods that have a lot of fiber, such as fresh fruits and vegetables, whole grains, and beans.  Limit foods that are high in fat, low in fiber, or overly processed, such as french fries, hamburgers, cookies, candies, and soda.  Drink enough fluid to keep your urine clear or pale yellow. General instructions  Exercise regularly or as told by your health care provider.  Go to the restroom when you have the urge to go. Do not hold it in.  Take over-the-counter and prescription medicines only as told by your health care provider. These include any fiber supplements.  Practice pelvic floor retraining exercises, such as deep breathing while relaxing the lower abdomen and pelvic floor relaxation during bowel movements.  Watch your condition for any changes.  Keep all follow-up visits as told by your health care provider. This is important. Contact a health care provider if:  You have pain that gets worse.  You have a fever.  You do not have a bowel movement after 4 days.  You vomit.  You are not hungry.  You lose weight.  You are bleeding from the anus.  You have thin, pencil-like stools. Get help right away if:  You have a fever and your symptoms suddenly get worse.  You leak stool or have blood in your stool.  Your abdomen is bloated.  You have severe pain in your abdomen.  You feel dizzy or you faint. This information is not intended to replace advice given to  you by your health care provider. Make sure you discuss any questions you have with your health care provider. Document Released: 09/10/2004 Document Revised: 11/25/2017 Document Reviewed: 06/02/2016 Elsevier Patient Education  2020 [employer].

## 2019-11-02 DIAGNOSIS — D485 Neoplasm of uncertain behavior of skin: Secondary | ICD-10-CM | POA: Diagnosis not present

## 2019-11-02 DIAGNOSIS — Z85828 Personal history of other malignant neoplasm of skin: Secondary | ICD-10-CM | POA: Diagnosis not present

## 2019-11-02 DIAGNOSIS — C44722 Squamous cell carcinoma of skin of right lower limb, including hip: Secondary | ICD-10-CM | POA: Diagnosis not present

## 2019-11-19 DIAGNOSIS — Z20828 Contact with and (suspected) exposure to other viral communicable diseases: Secondary | ICD-10-CM | POA: Diagnosis not present

## 2019-11-19 DIAGNOSIS — Z9189 Other specified personal risk factors, not elsewhere classified: Secondary | ICD-10-CM | POA: Diagnosis not present

## 2019-12-27 DIAGNOSIS — Z85828 Personal history of other malignant neoplasm of skin: Secondary | ICD-10-CM | POA: Diagnosis not present

## 2019-12-27 DIAGNOSIS — L570 Actinic keratosis: Secondary | ICD-10-CM | POA: Diagnosis not present

## 2019-12-27 DIAGNOSIS — M310 Hypersensitivity angiitis: Secondary | ICD-10-CM | POA: Diagnosis not present

## 2019-12-27 DIAGNOSIS — L821 Other seborrheic keratosis: Secondary | ICD-10-CM | POA: Diagnosis not present

## 2019-12-27 DIAGNOSIS — L609 Nail disorder, unspecified: Secondary | ICD-10-CM | POA: Diagnosis not present

## 2019-12-27 DIAGNOSIS — L680 Hirsutism: Secondary | ICD-10-CM | POA: Diagnosis not present

## 2019-12-27 DIAGNOSIS — D485 Neoplasm of uncertain behavior of skin: Secondary | ICD-10-CM | POA: Diagnosis not present

## 2019-12-27 DIAGNOSIS — B351 Tinea unguium: Secondary | ICD-10-CM | POA: Diagnosis not present

## 2020-02-20 DIAGNOSIS — H26491 Other secondary cataract, right eye: Secondary | ICD-10-CM | POA: Diagnosis not present

## 2020-02-20 DIAGNOSIS — H52203 Unspecified astigmatism, bilateral: Secondary | ICD-10-CM | POA: Diagnosis not present

## 2020-02-20 DIAGNOSIS — Z961 Presence of intraocular lens: Secondary | ICD-10-CM | POA: Diagnosis not present

## 2020-03-06 DIAGNOSIS — M109 Gout, unspecified: Secondary | ICD-10-CM | POA: Diagnosis not present

## 2020-03-06 DIAGNOSIS — E7849 Other hyperlipidemia: Secondary | ICD-10-CM | POA: Diagnosis not present

## 2020-03-07 DIAGNOSIS — L821 Other seborrheic keratosis: Secondary | ICD-10-CM | POA: Diagnosis not present

## 2020-03-07 DIAGNOSIS — L570 Actinic keratosis: Secondary | ICD-10-CM | POA: Diagnosis not present

## 2020-03-07 DIAGNOSIS — Z85828 Personal history of other malignant neoplasm of skin: Secondary | ICD-10-CM | POA: Diagnosis not present

## 2020-03-10 DIAGNOSIS — R82998 Other abnormal findings in urine: Secondary | ICD-10-CM | POA: Diagnosis not present

## 2020-03-10 DIAGNOSIS — I10 Essential (primary) hypertension: Secondary | ICD-10-CM | POA: Diagnosis not present

## 2020-03-13 DIAGNOSIS — C4492 Squamous cell carcinoma of skin, unspecified: Secondary | ICD-10-CM | POA: Diagnosis not present

## 2020-03-13 DIAGNOSIS — D72819 Decreased white blood cell count, unspecified: Secondary | ICD-10-CM | POA: Diagnosis not present

## 2020-03-13 DIAGNOSIS — R05 Cough: Secondary | ICD-10-CM | POA: Diagnosis not present

## 2020-03-13 DIAGNOSIS — Z0000 Encounter for general adult medical examination without abnormal findings: Secondary | ICD-10-CM | POA: Diagnosis not present

## 2020-03-13 DIAGNOSIS — E669 Obesity, unspecified: Secondary | ICD-10-CM | POA: Diagnosis not present

## 2020-03-13 DIAGNOSIS — N3941 Urge incontinence: Secondary | ICD-10-CM | POA: Diagnosis not present

## 2020-03-13 DIAGNOSIS — R946 Abnormal results of thyroid function studies: Secondary | ICD-10-CM | POA: Diagnosis not present

## 2020-03-13 DIAGNOSIS — E278 Other specified disorders of adrenal gland: Secondary | ICD-10-CM | POA: Diagnosis not present

## 2020-03-13 DIAGNOSIS — R600 Localized edema: Secondary | ICD-10-CM | POA: Diagnosis not present

## 2020-03-13 DIAGNOSIS — D696 Thrombocytopenia, unspecified: Secondary | ICD-10-CM | POA: Diagnosis not present

## 2020-03-13 DIAGNOSIS — I8310 Varicose veins of unspecified lower extremity with inflammation: Secondary | ICD-10-CM | POA: Diagnosis not present

## 2020-03-13 DIAGNOSIS — H269 Unspecified cataract: Secondary | ICD-10-CM | POA: Diagnosis not present

## 2020-03-20 DIAGNOSIS — H26491 Other secondary cataract, right eye: Secondary | ICD-10-CM | POA: Diagnosis not present

## 2020-03-21 DIAGNOSIS — Z1212 Encounter for screening for malignant neoplasm of rectum: Secondary | ICD-10-CM | POA: Diagnosis not present

## 2020-06-11 DIAGNOSIS — I1310 Hypertensive heart and chronic kidney disease without heart failure, with stage 1 through stage 4 chronic kidney disease, or unspecified chronic kidney disease: Secondary | ICD-10-CM | POA: Diagnosis not present

## 2020-06-11 DIAGNOSIS — M109 Gout, unspecified: Secondary | ICD-10-CM | POA: Diagnosis not present

## 2020-06-11 DIAGNOSIS — N1831 Chronic kidney disease, stage 3a: Secondary | ICD-10-CM | POA: Diagnosis not present

## 2020-07-22 ENCOUNTER — Other Ambulatory Visit: Payer: Self-pay | Admitting: Internal Medicine

## 2020-07-22 DIAGNOSIS — Z1231 Encounter for screening mammogram for malignant neoplasm of breast: Secondary | ICD-10-CM

## 2020-08-07 ENCOUNTER — Ambulatory Visit
Admission: RE | Admit: 2020-08-07 | Discharge: 2020-08-07 | Disposition: A | Discharge status: Home / Self Care | Payer: PPO | Source: Ambulatory Visit | Attending: Internal Medicine | Admitting: Internal Medicine

## 2020-08-07 ENCOUNTER — Other Ambulatory Visit: Payer: Self-pay

## 2020-08-07 DIAGNOSIS — Z1231 Encounter for screening mammogram for malignant neoplasm of breast: Secondary | ICD-10-CM

## 2020-08-12 DIAGNOSIS — L570 Actinic keratosis: Secondary | ICD-10-CM | POA: Diagnosis not present

## 2020-08-12 DIAGNOSIS — L738 Other specified follicular disorders: Secondary | ICD-10-CM | POA: Diagnosis not present

## 2020-08-12 DIAGNOSIS — Z85828 Personal history of other malignant neoplasm of skin: Secondary | ICD-10-CM | POA: Diagnosis not present

## 2020-09-12 DIAGNOSIS — I1310 Hypertensive heart and chronic kidney disease without heart failure, with stage 1 through stage 4 chronic kidney disease, or unspecified chronic kidney disease: Secondary | ICD-10-CM | POA: Diagnosis not present

## 2020-09-12 DIAGNOSIS — E669 Obesity, unspecified: Secondary | ICD-10-CM | POA: Diagnosis not present

## 2020-09-12 DIAGNOSIS — M1990 Unspecified osteoarthritis, unspecified site: Secondary | ICD-10-CM | POA: Diagnosis not present

## 2020-09-12 DIAGNOSIS — F325 Major depressive disorder, single episode, in full remission: Secondary | ICD-10-CM | POA: Diagnosis not present

## 2020-09-12 DIAGNOSIS — Z23 Encounter for immunization: Secondary | ICD-10-CM | POA: Diagnosis not present

## 2020-09-12 DIAGNOSIS — M8580 Other specified disorders of bone density and structure, unspecified site: Secondary | ICD-10-CM | POA: Diagnosis not present

## 2020-09-12 DIAGNOSIS — R600 Localized edema: Secondary | ICD-10-CM | POA: Diagnosis not present

## 2020-09-12 DIAGNOSIS — D72819 Decreased white blood cell count, unspecified: Secondary | ICD-10-CM | POA: Diagnosis not present

## 2020-09-12 DIAGNOSIS — D692 Other nonthrombocytopenic purpura: Secondary | ICD-10-CM | POA: Diagnosis not present

## 2020-09-12 DIAGNOSIS — D696 Thrombocytopenia, unspecified: Secondary | ICD-10-CM | POA: Diagnosis not present

## 2020-09-12 DIAGNOSIS — N1831 Chronic kidney disease, stage 3a: Secondary | ICD-10-CM | POA: Diagnosis not present

## 2020-09-12 DIAGNOSIS — E785 Hyperlipidemia, unspecified: Secondary | ICD-10-CM | POA: Diagnosis not present

## 2020-09-22 ENCOUNTER — Other Ambulatory Visit (HOSPITAL_COMMUNITY)
Admission: RE | Admit: 2020-09-22 | Discharge: 2020-09-22 | Disposition: A | Discharge status: Home / Self Care | Payer: PPO | Source: Ambulatory Visit | Attending: Obstetrics and Gynecology | Admitting: Obstetrics and Gynecology

## 2020-09-22 ENCOUNTER — Other Ambulatory Visit: Payer: Self-pay

## 2020-09-22 ENCOUNTER — Ambulatory Visit (INDEPENDENT_AMBULATORY_CARE_PROVIDER_SITE_OTHER): Payer: PPO | Admitting: Obstetrics and Gynecology

## 2020-09-22 ENCOUNTER — Encounter: Payer: Self-pay | Admitting: Obstetrics and Gynecology

## 2020-09-22 VITALS — BP 140/78 | HR 84 | Resp 16 | Ht 64.0 in | Wt 193.4 lb

## 2020-09-22 DIAGNOSIS — Z01419 Encounter for gynecological examination (general) (routine) without abnormal findings: Secondary | ICD-10-CM

## 2020-09-22 DIAGNOSIS — N3281 Overactive bladder: Secondary | ICD-10-CM | POA: Diagnosis not present

## 2020-09-22 MED ORDER — MIRABEGRON ER 25 MG PO TB24: 25.0000 mg | ORAL_TABLET | Freq: Every day | ORAL | 1 refills | Status: DC

## 2020-09-22 NOTE — Progress Notes (Signed)
86 y.o. G3P3 Married Caucasian female here for annual exam.    States her bladder control is worse.  Wearing a pad.  She has constipation with Ditropan XL use.   Coffee at breakfast.  Glass of wine occasionally.  No dysuria.  No blood in the urine.   She has a blood pressure cuff at home.   PCP:   Shon Baton, MD  Patient's last menstrual period was 12/27/2000.           Sexually active: No.  The current method of family planning is post menopausal status.    Exercising: No.  The patient does not participate in regular exercise at present. Smoker:  no  Health Maintenance: Pap: 09-15-18 Neg, 09-11-16 Neg History of abnormal Pap:  no MMG: 08-07-20  3D/Neg/density B/BiRads1 Colonoscopy: 06-13-14 Normal BMD: 2019  Result :Osteopenia per patient--Dr.Russo TDaP:  PCP Gardasil:   no TDD:[facility] Hep C:unsure Screening Labs:  PCP   reports that she has never smoked. She has never used smokeless tobacco. She reports current alcohol use of about 2.0 standard drinks of alcohol per week. She reports that she does not use drugs.  Past Medical History:  Diagnosis Date  . Arthritis    osteoarthritis  . Complication of anesthesia    SLOW TO AWAKE AFTER 2008 SURGERY, PREFERS SPINAL IF POSSIBLE  . Diverticulosis 2001  . Elevated cholesterol   . Hepatitis 1979   HEPATITIS A WITH JAUNDICE- NO ISSUES NOW  . Hypertension   . Varicose veins     Past Surgical History:  Procedure Laterality Date  . cataract surgery  2016 and 2017   bilateral surgery  . COLONOSCOPY WITH PROPOFOL N/A 06/13/2014   Procedure: COLONOSCOPY WITH PROPOFOL;  Surgeon: Cleotis Nipper, MD;  Location: [facility];  Service: Endoscopy;  Laterality: N/A;  . COMPLEX HYPERPLASIC  2002   w/o atypia in an  endocx polyp  . DILATION AND CURETTAGE OF UTERUS  YRS AGO   X 2  . HYSTEROSCOPY  12/2000   D&C  . JOINT REPLACEMENT  12/2011   left knee  . JOINT REPLACEMENT  2008   right knee  . KNEE CLOSED REDUCTION  05/15/2012    Procedure: CLOSED MANIPULATION KNEE;  Surgeon: Gearlean Alf, MD;  Location: [facility];  Service: Orthopedics;  Laterality: Left;  . RIGHT ROTATOR CUFF REPAIR  2005  . RIGHT TOTAL KNEE REPLACMENT  2008  . SQUAMOUS CELL CARCINOMA EXCISION  06/2014   -left leg  . TONSILLECTOMY     at 18 yrs. old  . TOTAL KNEE ARTHROPLASTY  01/19/2012   Procedure: TOTAL KNEE ARTHROPLASTY;  Surgeon: Gearlean Alf, MD;  Location: [facility];  Service: Orthopedics;  Laterality: Left;    Current Outpatient Medications  Medication Sig Dispense Refill  . acetaminophen (TYLENOL) 500 MG tablet Take 1,000 mg by mouth every 6 (six) hours as needed for mild pain, moderate pain, fever or headache.    Marland Kitchen aspirin EC 81 MG tablet Take 81 mg by mouth at bedtime.     Marland Kitchen BIOTIN PO Take 1 tablet by mouth daily.    . Calcium Citrate-Vitamin D (CITRACAL + D PO) Take 2 tablets by mouth 2 (two) times daily.     . fluorometholone (FML) 0.1 % ophthalmic suspension Place 1 drop into both eyes daily as needed for dry eyes.    Marland Kitchen glucosamine-chondroitin 500-400 MG tablet Take 1 tablet by mouth 2 (two) times daily.     . indapamide (LOZOL) 2.5  MG tablet Take 2.5 mg by mouth daily with breakfast.     . losartan (COZAAR) 25 MG tablet     . Multiple Vitamin (MULITIVITAMIN WITH MINERALS) TABS Take 1 tablet by mouth daily with breakfast.     . mupirocin ointment (BACTROBAN) 2 %     . oxybutynin (DITROPAN XL) 15 MG 24 hr tablet Take 1 tablet (15 mg total) by mouth at bedtime. Take three nights per week. 90 tablet 3  . rosuvastatin (CRESTOR) 10 MG tablet Take 10 mg by mouth every Monday, Wednesday, and Friday.     . traZODone (DESYREL) 100 MG tablet Take 1 tablet by mouth at bedtime.    Marland Kitchen venlafaxine XR (EFFEXOR-XR) 37.5 MG 24 hr capsule Take 1 capsule by mouth daily.     No current facility-administered medications for this visit.    Family History  Problem Relation Age of Onset  . CVA Mother   . Heart failure Mother   . Heart failure  Father   . Breast cancer Sister     Review of Systems  Genitourinary:       Urinary incontinence  All other systems reviewed and are negative.   Exam:   BP 140/78 (Cuff Size: Large)   Pulse 84   Resp 16   Ht 5\' 4"  (1.626 m)   Wt 193 lb 6.4 oz (87.7 kg)   LMP 12/27/2000   BMI 33.20 kg/m     General appearance: alert, cooperative and appears stated age Head: normocephalic, without obvious abnormality, atraumatic Neck: no adenopathy, supple, symmetrical, trachea midline and thyroid normal to inspection and palpation Lungs: clear to auscultation bilaterally Breasts: normal appearance, no masses or tenderness, No nipple retraction or dimpling, No nipple discharge or bleeding, No axillary adenopathy Heart: regular rate and rhythm Abdomen: soft, non-tender; no masses, no organomegaly Extremities: extremities normal, atraumatic, no cyanosis or edema Skin: skin color, texture, turgor normal. No rashes or lesions Lymph nodes: cervical, supraclavicular, and axillary nodes normal. Neurologic: grossly normal  Pelvic: External genitalia:  no lesions.  Urinary leakage noted with shifting on exam table.              No abnormal inguinal nodes palpated.              Urethra:  normal appearing urethra with no masses, tenderness or lesions              Bartholins and Skenes: normal                 Vagina: normal appearing vagina with normal color and discharge, no lesions              Cervix: no lesions              Pap taken: Yes.   Bimanual Exam:  Uterus:  normal size, contour, position, consistency, mobility, non-tender              Adnexa: no mass, fullness, tenderness              Rectal exam: Yes.  .  Confirms.              Anus:  normal sphincter tone, no lesions  Chaperone was present for exam.  Assessment:   Well woman visit with normal exam. Osteopenia. Off Fosamax. Overactive bladder. Constipation. FH breast cancer in sister.  Plan: Mammogram screening discussed. Self  breast awareness reviewed. Pap and HR HPV as above. Guidelines for Calcium, Vitamin D, regular exercise program  including cardiovascular and weight bearing exercise. Stop Ditropan XL and start Myrbetriq 25 mg daily.  She will follow up in 6 weeks and take her blood pressure daily.  She will call if it is elevated above her baseline.  Follow up annually and prn.   After visit summary provided.

## 2020-09-22 NOTE — Patient Instructions (Signed)

## 2020-09-23 LAB — CYTOLOGY - PAP: Diagnosis: NEGATIVE

## 2020-09-25 DIAGNOSIS — Z471 Aftercare following joint replacement surgery: Secondary | ICD-10-CM | POA: Diagnosis not present

## 2020-09-25 DIAGNOSIS — Z96651 Presence of right artificial knee joint: Secondary | ICD-10-CM | POA: Diagnosis not present

## 2020-09-25 DIAGNOSIS — Z96652 Presence of left artificial knee joint: Secondary | ICD-10-CM | POA: Diagnosis not present

## 2020-09-25 DIAGNOSIS — Z96653 Presence of artificial knee joint, bilateral: Secondary | ICD-10-CM | POA: Diagnosis not present

## 2020-10-29 DIAGNOSIS — C44729 Squamous cell carcinoma of skin of left lower limb, including hip: Secondary | ICD-10-CM | POA: Diagnosis not present

## 2020-10-29 DIAGNOSIS — D485 Neoplasm of uncertain behavior of skin: Secondary | ICD-10-CM | POA: Diagnosis not present

## 2020-10-29 DIAGNOSIS — L738 Other specified follicular disorders: Secondary | ICD-10-CM | POA: Diagnosis not present

## 2020-10-29 DIAGNOSIS — L821 Other seborrheic keratosis: Secondary | ICD-10-CM | POA: Diagnosis not present

## 2020-10-29 DIAGNOSIS — Z85828 Personal history of other malignant neoplasm of skin: Secondary | ICD-10-CM | POA: Diagnosis not present

## 2020-10-29 DIAGNOSIS — L570 Actinic keratosis: Secondary | ICD-10-CM | POA: Diagnosis not present

## 2020-11-04 NOTE — Progress Notes (Signed)
GYNECOLOGY  VISIT   HPI: 86 y.o.   Married  Caucasian  female   G3P3 with Patient's last menstrual period was 12/27/2000.   here for 6 week medication follow up.   Patient doesn't feel Myrbetriq is helping much. Urgency is better.   She is monitoring her blood pressure at home. 104 - 147/70 - 90.  She was using Ditropan three days a week, and she had some constipation.   Wears a pad all of the time.  Leaks with a sneeze.   Up once per night.  Voids 3 -4 times per day.  No uncontrolled leakage more than usual.  Rare leakage for no reason at all.  Would like to not leak.   GYNECOLOGIC HISTORY: Patient's last menstrual period was 12/27/2000. Contraception: PMP Menopausal hormone therapy: none Last mammogram: 08-07-20  3D/Neg/density B/BiRads1 Last pap smear:  09-22-20 Neg, 09-15-18 Neg, 09-11-16 Neg        OB History    Gravida  3   Para  3   Term      Preterm      AB      Living  3     SAB      TAB      Ectopic      Multiple      Live Births                 Patient Active Problem List   Diagnosis Date Noted  . Mixed incontinence 09/23/2014    Past Medical History:  Diagnosis Date  . Arthritis    osteoarthritis  . Complication of anesthesia    SLOW TO AWAKE AFTER 2008 SURGERY, PREFERS SPINAL IF POSSIBLE  . Diverticulosis 2001  . Elevated cholesterol   . Hepatitis 1979   HEPATITIS A WITH JAUNDICE- NO ISSUES NOW  . Hypertension   . Varicose veins     Past Surgical History:  Procedure Laterality Date  . cataract surgery  2016 and 2017   bilateral surgery  . COLONOSCOPY WITH PROPOFOL N/A 06/13/2014   Procedure: COLONOSCOPY WITH PROPOFOL;  Surgeon: Cleotis Nipper, MD;  Location: [facility];  Service: Endoscopy;  Laterality: N/A;  . COMPLEX HYPERPLASIC  2002   w/o atypia in an  endocx polyp  . DILATION AND CURETTAGE OF UTERUS  YRS AGO   X 2  . HYSTEROSCOPY  12/2000   D&C  . JOINT REPLACEMENT  12/2011   left knee  . JOINT REPLACEMENT   2008   right knee  . KNEE CLOSED REDUCTION  05/15/2012   Procedure: CLOSED MANIPULATION KNEE;  Surgeon: Gearlean Alf, MD;  Location: [facility];  Service: Orthopedics;  Laterality: Left;  . RIGHT ROTATOR CUFF REPAIR  2005  . RIGHT TOTAL KNEE REPLACMENT  2008  . SQUAMOUS CELL CARCINOMA EXCISION  06/2014   -left leg  . TONSILLECTOMY     at 18 yrs. old  . TOTAL KNEE ARTHROPLASTY  01/19/2012   Procedure: TOTAL KNEE ARTHROPLASTY;  Surgeon: Gearlean Alf, MD;  Location: [facility];  Service: Orthopedics;  Laterality: Left;    Current Outpatient Medications  Medication Sig Dispense Refill  . acetaminophen (TYLENOL) 500 MG tablet Take 1,000 mg by mouth every 6 (six) hours as needed for mild pain, moderate pain, fever or headache.    Marland Kitchen aspirin EC 81 MG tablet Take 81 mg by mouth at bedtime.     Marland Kitchen BIOTIN PO Take 1 tablet by mouth daily.    . Calcium  Citrate-Vitamin D (CITRACAL + D PO) Take 2 tablets by mouth 2 (two) times daily.     . fluorometholone (FML) 0.1 % ophthalmic suspension Place 1 drop into both eyes daily as needed for dry eyes.    Marland Kitchen glucosamine-chondroitin 500-400 MG tablet Take 1 tablet by mouth 2 (two) times daily.     . indapamide (LOZOL) 2.5 MG tablet Take 2.5 mg by mouth daily with breakfast.     . losartan (COZAAR) 25 MG tablet     . mirabegron ER (MYRBETRIQ) 25 MG TB24 tablet Take 1 tablet (25 mg total) by mouth daily. 30 tablet 1  . Multiple Vitamin (MULITIVITAMIN WITH MINERALS) TABS Take 1 tablet by mouth daily with breakfast.     . mupirocin ointment (BACTROBAN) 2 %     . rosuvastatin (CRESTOR) 10 MG tablet Take 10 mg by mouth every Monday, Wednesday, and Friday.     . traZODone (DESYREL) 100 MG tablet Take 1 tablet by mouth at bedtime.    Marland Kitchen venlafaxine XR (EFFEXOR-XR) 37.5 MG 24 hr capsule Take 1 capsule by mouth daily.     No current facility-administered medications for this visit.     ALLERGIES: Patient has no known allergies.  Family History  Problem Relation Age of  Onset  . CVA Mother   . Heart failure Mother   . Heart failure Father   . Breast cancer Sister     Social History   Socioeconomic History  . Marital status: Married    Spouse name: Not on file  . Number of children: Not on file  . Years of education: Not on file  . Highest education level: Not on file  Occupational History  . Not on file  Tobacco Use  . Smoking status: Never Smoker  . Smokeless tobacco: Never Used  Vaping Use  . Vaping Use: Never used  Substance and Sexual Activity  . Alcohol use: Yes    Alcohol/week: 2.0 standard drinks    Types: 2 Glasses of wine per week    Comment: 2glasses of wine a week  . Drug use: No  . Sexual activity: Never    Partners: Male    Birth control/protection: Post-menopausal  Other Topics Concern  . Not on file  Social History Narrative  . Not on file   Social Determinants of Health   Financial Resource Strain:   . Difficulty of Paying Living Expenses: Not on file  Food Insecurity:   . Worried About Charity fundraiser in the Last Year: Not on file  . Ran Out of Food in the Last Year: Not on file  Transportation Needs:   . Lack of Transportation (Medical): Not on file  . Lack of Transportation (Non-Medical): Not on file  Physical Activity:   . Days of Exercise per Week: Not on file  . Minutes of Exercise per Session: Not on file  Stress:   . Feeling of Stress : Not on file  Social Connections:   . Frequency of Communication with Friends and Family: Not on file  . Frequency of Social Gatherings with Friends and Family: Not on file  . Attends Religious Services: Not on file  . Active Member of Clubs or Organizations: Not on file  . Attends Archivist Meetings: Not on file  . Marital Status: Not on file  Intimate Partner Violence:   . Fear of Current or Ex-Partner: Not on file  . Emotionally Abused: Not on file  . Physically Abused: Not on  file  . Sexually Abused: Not on file    Review of Systems  All other  systems reviewed and are negative.   PHYSICAL EXAMINATION:    BP 132/74 (Cuff Size: Large)   Pulse 85   Ht 5\' 4"  (1.626 m)   Wt 195 lb (88.5 kg)   LMP 12/27/2000   SpO2 95%   BMI 33.47 kg/m     General appearance: alert, cooperative and appears stated age  ASSESSMENT  Mixed incontinence.  Overactive bladder is somewhat improved. On Myrbetriq.   HTN.    PLAN  We talked about the differences between stress incontinence and overactive bladder incontinence and how the Myrbetriq will not treat leakage with a sneeze, laugh, or cough. Will increase to Myrbetriq 50 mg daily.  She will continue to monitor her BP and contact me if elevated or if she develops sinusitis symptoms.  FU in 8 weeks after the Holidays.

## 2020-11-05 ENCOUNTER — Other Ambulatory Visit: Payer: Self-pay

## 2020-11-05 ENCOUNTER — Ambulatory Visit: Payer: PPO | Admitting: Obstetrics and Gynecology

## 2020-11-05 ENCOUNTER — Encounter: Payer: Self-pay | Admitting: Obstetrics and Gynecology

## 2020-11-05 VITALS — BP 132/74 | HR 85 | Ht 64.0 in | Wt 195.0 lb

## 2020-11-05 DIAGNOSIS — Z5181 Encounter for therapeutic drug level monitoring: Secondary | ICD-10-CM

## 2020-11-05 DIAGNOSIS — N3281 Overactive bladder: Secondary | ICD-10-CM | POA: Diagnosis not present

## 2020-11-05 MED ORDER — MIRABEGRON ER 50 MG PO TB24: 50.0000 mg | ORAL_TABLET | Freq: Every day | ORAL | 0 refills | Status: DC

## 2020-11-05 NOTE — Progress Notes (Signed)
Note opened due to break in wifi connection.

## 2020-11-06 DIAGNOSIS — Z96653 Presence of artificial knee joint, bilateral: Secondary | ICD-10-CM | POA: Diagnosis not present

## 2020-11-06 DIAGNOSIS — M62552 Muscle wasting and atrophy, not elsewhere classified, left thigh: Secondary | ICD-10-CM | POA: Diagnosis not present

## 2020-11-06 DIAGNOSIS — R2681 Unsteadiness on feet: Secondary | ICD-10-CM | POA: Diagnosis not present

## 2020-11-06 DIAGNOSIS — R2689 Other abnormalities of gait and mobility: Secondary | ICD-10-CM | POA: Diagnosis not present

## 2020-11-06 DIAGNOSIS — M62551 Muscle wasting and atrophy, not elsewhere classified, right thigh: Secondary | ICD-10-CM | POA: Diagnosis not present

## 2020-11-10 DIAGNOSIS — R2689 Other abnormalities of gait and mobility: Secondary | ICD-10-CM | POA: Diagnosis not present

## 2020-11-10 DIAGNOSIS — M62551 Muscle wasting and atrophy, not elsewhere classified, right thigh: Secondary | ICD-10-CM | POA: Diagnosis not present

## 2020-11-10 DIAGNOSIS — R2681 Unsteadiness on feet: Secondary | ICD-10-CM | POA: Diagnosis not present

## 2020-11-10 DIAGNOSIS — M62552 Muscle wasting and atrophy, not elsewhere classified, left thigh: Secondary | ICD-10-CM | POA: Diagnosis not present

## 2020-11-10 DIAGNOSIS — Z96653 Presence of artificial knee joint, bilateral: Secondary | ICD-10-CM | POA: Diagnosis not present

## 2020-11-14 DIAGNOSIS — Z96653 Presence of artificial knee joint, bilateral: Secondary | ICD-10-CM | POA: Diagnosis not present

## 2020-11-14 DIAGNOSIS — M62551 Muscle wasting and atrophy, not elsewhere classified, right thigh: Secondary | ICD-10-CM | POA: Diagnosis not present

## 2020-11-14 DIAGNOSIS — R2681 Unsteadiness on feet: Secondary | ICD-10-CM | POA: Diagnosis not present

## 2020-11-14 DIAGNOSIS — M62552 Muscle wasting and atrophy, not elsewhere classified, left thigh: Secondary | ICD-10-CM | POA: Diagnosis not present

## 2020-11-14 DIAGNOSIS — R2689 Other abnormalities of gait and mobility: Secondary | ICD-10-CM | POA: Diagnosis not present

## 2020-11-17 DIAGNOSIS — M62552 Muscle wasting and atrophy, not elsewhere classified, left thigh: Secondary | ICD-10-CM | POA: Diagnosis not present

## 2020-11-17 DIAGNOSIS — Z96653 Presence of artificial knee joint, bilateral: Secondary | ICD-10-CM | POA: Diagnosis not present

## 2020-11-17 DIAGNOSIS — R2689 Other abnormalities of gait and mobility: Secondary | ICD-10-CM | POA: Diagnosis not present

## 2020-11-17 DIAGNOSIS — M62551 Muscle wasting and atrophy, not elsewhere classified, right thigh: Secondary | ICD-10-CM | POA: Diagnosis not present

## 2020-11-17 DIAGNOSIS — R2681 Unsteadiness on feet: Secondary | ICD-10-CM | POA: Diagnosis not present

## 2020-11-19 DIAGNOSIS — Z96653 Presence of artificial knee joint, bilateral: Secondary | ICD-10-CM | POA: Diagnosis not present

## 2020-11-19 DIAGNOSIS — R2689 Other abnormalities of gait and mobility: Secondary | ICD-10-CM | POA: Diagnosis not present

## 2020-11-19 DIAGNOSIS — R2681 Unsteadiness on feet: Secondary | ICD-10-CM | POA: Diagnosis not present

## 2020-11-19 DIAGNOSIS — M62552 Muscle wasting and atrophy, not elsewhere classified, left thigh: Secondary | ICD-10-CM | POA: Diagnosis not present

## 2020-11-19 DIAGNOSIS — M62551 Muscle wasting and atrophy, not elsewhere classified, right thigh: Secondary | ICD-10-CM | POA: Diagnosis not present

## 2020-11-27 DIAGNOSIS — M62551 Muscle wasting and atrophy, not elsewhere classified, right thigh: Secondary | ICD-10-CM | POA: Diagnosis not present

## 2020-11-27 DIAGNOSIS — R2681 Unsteadiness on feet: Secondary | ICD-10-CM | POA: Diagnosis not present

## 2020-11-27 DIAGNOSIS — R2689 Other abnormalities of gait and mobility: Secondary | ICD-10-CM | POA: Diagnosis not present

## 2020-11-27 DIAGNOSIS — M62552 Muscle wasting and atrophy, not elsewhere classified, left thigh: Secondary | ICD-10-CM | POA: Diagnosis not present

## 2020-11-27 DIAGNOSIS — Z96653 Presence of artificial knee joint, bilateral: Secondary | ICD-10-CM | POA: Diagnosis not present

## 2020-12-05 DIAGNOSIS — R2689 Other abnormalities of gait and mobility: Secondary | ICD-10-CM | POA: Diagnosis not present

## 2020-12-05 DIAGNOSIS — M62551 Muscle wasting and atrophy, not elsewhere classified, right thigh: Secondary | ICD-10-CM | POA: Diagnosis not present

## 2020-12-05 DIAGNOSIS — R2681 Unsteadiness on feet: Secondary | ICD-10-CM | POA: Diagnosis not present

## 2020-12-05 DIAGNOSIS — M62552 Muscle wasting and atrophy, not elsewhere classified, left thigh: Secondary | ICD-10-CM | POA: Diagnosis not present

## 2020-12-05 DIAGNOSIS — Z96653 Presence of artificial knee joint, bilateral: Secondary | ICD-10-CM | POA: Diagnosis not present

## 2020-12-08 ENCOUNTER — Ambulatory Visit: Payer: PPO | Admitting: Obstetrics and Gynecology

## 2020-12-08 ENCOUNTER — Other Ambulatory Visit: Payer: Self-pay

## 2020-12-08 ENCOUNTER — Telehealth: Payer: Self-pay

## 2020-12-08 ENCOUNTER — Encounter: Payer: Self-pay | Admitting: Obstetrics and Gynecology

## 2020-12-08 VITALS — BP 140/72 | HR 96 | Ht 64.0 in | Wt 193.2 lb

## 2020-12-08 DIAGNOSIS — R32 Unspecified urinary incontinence: Secondary | ICD-10-CM

## 2020-12-08 DIAGNOSIS — N3090 Cystitis, unspecified without hematuria: Secondary | ICD-10-CM

## 2020-12-08 LAB — POCT URINALYSIS DIPSTICK
Bilirubin, UA: NEGATIVE
Glucose, UA: NEGATIVE
Ketones, UA: NEGATIVE
Nitrite, UA: NEGATIVE
Protein, UA: POSITIVE — AB
Urobilinogen, UA: NEGATIVE E.U./dL — AB
pH, UA: 5 (ref 5.0–8.0)

## 2020-12-08 MED ORDER — SULFAMETHOXAZOLE-TRIMETHOPRIM 800-160 MG PO TABS: 1.0000 | ORAL_TABLET | Freq: Two times a day (BID) | ORAL | 0 refills | Status: DC

## 2020-12-08 NOTE — Patient Instructions (Signed)

## 2020-12-08 NOTE — Telephone Encounter (Signed)
Patient says she is having some incontinence and feels like she has a uti.

## 2020-12-08 NOTE — Progress Notes (Signed)
GYNECOLOGY  VISIT   HPI: 86 y.o.   Married White or Caucasian Not Hispanic or Latino  female   G3P3 with Patient's last menstrual period was 12/27/2000.   here for UTI symptoms. Patient states that she is having urinary incontinence, urgency frequency, burning and pain since Thursday 12.9/21 Patient states that she increased her Mybetriq  On 11/15/20 to 50mg  and wants to know if it is related to her symptoms.   She reports Worsening urinary incontinence, intermittent dysuria and urinary frequency since last Thursday. Just doesn't feel well. No fever or flank pain.   She denies any history of kidney disease. H/O hepatitis in 1979, she is unaware of any liver issues.   GYNECOLOGIC HISTORY: Patient's last menstrual period was 12/27/2000. Contraception:PMP Menopausal hormone therapy: none         OB History    Gravida  3   Para  3   Term      Preterm      AB      Living  3     SAB      IAB      Ectopic      Multiple      Live Births                 Patient Active Problem List   Diagnosis Date Noted  . Mixed incontinence 09/23/2014    Past Medical History:  Diagnosis Date  . Arthritis    osteoarthritis  . Complication of anesthesia    SLOW TO AWAKE AFTER 2008 SURGERY, PREFERS SPINAL IF POSSIBLE  . Diverticulosis 2001  . Elevated cholesterol   . Hepatitis 1979   HEPATITIS A WITH JAUNDICE- NO ISSUES NOW  . Hypertension   . Varicose veins     Past Surgical History:  Procedure Laterality Date  . cataract surgery  2016 and 2017   bilateral surgery  . COLONOSCOPY WITH PROPOFOL N/A 06/13/2014   Procedure: COLONOSCOPY WITH PROPOFOL;  Surgeon: Cleotis Nipper, MD;  Location: [facility];  Service: Endoscopy;  Laterality: N/A;  . COMPLEX HYPERPLASIC  2002   w/o atypia in an  endocx polyp  . DILATION AND CURETTAGE OF UTERUS  YRS AGO   X 2  . HYSTEROSCOPY  12/2000   D&C  . JOINT REPLACEMENT  12/2011   left knee  . JOINT REPLACEMENT  2008   right knee  .  KNEE CLOSED REDUCTION  05/15/2012   Procedure: CLOSED MANIPULATION KNEE;  Surgeon: Gearlean Alf, MD;  Location: [facility];  Service: Orthopedics;  Laterality: Left;  . RIGHT ROTATOR CUFF REPAIR  2005  . RIGHT TOTAL KNEE REPLACMENT  2008  . SQUAMOUS CELL CARCINOMA EXCISION  06/2014   -left leg  . TONSILLECTOMY     at 18 yrs. old  . TOTAL KNEE ARTHROPLASTY  01/19/2012   Procedure: TOTAL KNEE ARTHROPLASTY;  Surgeon: Gearlean Alf, MD;  Location: [facility];  Service: Orthopedics;  Laterality: Left;    Current Outpatient Medications  Medication Sig Dispense Refill  . acetaminophen (TYLENOL) 500 MG tablet Take 1,000 mg by mouth every 6 (six) hours as needed for mild pain, moderate pain, fever or headache.    Marland Kitchen aspirin EC 81 MG tablet Take 81 mg by mouth at bedtime.     Marland Kitchen BIOTIN PO Take 1 tablet by mouth daily.    . Calcium Citrate-Vitamin D (CITRACAL + D PO) Take 2 tablets by mouth 2 (two) times daily.     Marland Kitchen  fluorometholone (FML) 0.1 % ophthalmic suspension Place 1 drop into both eyes daily as needed for dry eyes.    Marland Kitchen glucosamine-chondroitin 500-400 MG tablet Take 1 tablet by mouth 2 (two) times daily.     . indapamide (LOZOL) 2.5 MG tablet Take 2.5 mg by mouth daily with breakfast.     . losartan (COZAAR) 25 MG tablet     . mirabegron ER (MYRBETRIQ) 50 MG TB24 tablet Take 1 tablet (50 mg total) by mouth at bedtime. 90 tablet 0  . Multiple Vitamin (MULITIVITAMIN WITH MINERALS) TABS Take 1 tablet by mouth daily with breakfast.     . mupirocin ointment (BACTROBAN) 2 %     . rosuvastatin (CRESTOR) 10 MG tablet Take 10 mg by mouth every Monday, Wednesday, and Friday.     . traZODone (DESYREL) 100 MG tablet Take 1 tablet by mouth at bedtime.    Marland Kitchen venlafaxine XR (EFFEXOR-XR) 37.5 MG 24 hr capsule Take 1 capsule by mouth daily.     No current facility-administered medications for this visit.     ALLERGIES: Patient has no known allergies.  Family History  Problem Relation Age of Onset  . CVA  Mother   . Heart failure Mother   . Heart failure Father   . Breast cancer Sister     Social History   Socioeconomic History  . Marital status: Married    Spouse name: Not on file  . Number of children: Not on file  . Years of education: Not on file  . Highest education level: Not on file  Occupational History  . Not on file  Tobacco Use  . Smoking status: Never Smoker  . Smokeless tobacco: Never Used  Vaping Use  . Vaping Use: Never used  Substance and Sexual Activity  . Alcohol use: Yes    Alcohol/week: 2.0 standard drinks    Types: 2 Glasses of wine per week    Comment: 2glasses of wine a week  . Drug use: No  . Sexual activity: Never    Partners: Male    Birth control/protection: Post-menopausal  Other Topics Concern  . Not on file  Social History Narrative  . Not on file   Social Determinants of Health   Financial Resource Strain: Not on file  Food Insecurity: Not on file  Transportation Needs: Not on file  Physical Activity: Not on file  Stress: Not on file  Social Connections: Not on file  Intimate Partner Violence: Not on file    ROS No fever, no flank pain.  PHYSICAL EXAMINATION:    LMP 12/27/2000     General appearance: alert, cooperative and appears stated age Abdomen: soft, non-tender; non distended, no masses,  no organomegaly CVA: not tender  Urine dip: 2+ leuk   ASSESSMENT Worsening incontinence, dysuria, urinary frequency. Concerning for UTI    PLAN Send urine ua, c&s Bactrim    An After Visit Summary was printed and given to the patient.   CC: Dr Quincy Simmonds

## 2020-12-08 NOTE — Telephone Encounter (Signed)
AEX 09/22/20 with BS H/o OAB, take Myrbetriq  Spoke with pt. Pt states having urinary incontinence, urgency, frequency, burning and pain since Thursday 12/04/20. Pt denies fever, chills, back pain. Pt denies taking any OTC meds for treatment or pain.  Pt states she increased Myrbetriq on 11/05/20 to 50mg  and wants to know if related to sx she is having now.  Pt advised to have OV for evaluation. Pt agreeable. No appts available today with Dr Quincy Simmonds. Pt agreeable to see any provider today.  Pt scheduled with Dr Talbert Nan on 12/13 at 3pm. Pt verbalized understanding to date and time of appt. Routing to Dr Quincy Simmonds for review Encounter closed

## 2020-12-09 LAB — URINALYSIS, MICROSCOPIC ONLY
Bacteria, UA: NONE SEEN
Casts: NONE SEEN /lpf
WBC, UA: >30 /hpf — AB (ref 0–5)

## 2020-12-10 LAB — URINE CULTURE

## 2021-01-06 NOTE — Progress Notes (Unsigned)
GYNECOLOGY  VISIT   HPI: 87 y.o.   Married  Caucasian  female   G3P3 with Patient's last menstrual period was 12/27/2000.   here for   8 week Myrbetriq follow up.   Feels her bladder control is better.  Up once a night to void.  Leaks a little bit with a hard cough.  She states she is voiding.   BP at home:  148/85, 145/77, 136/83, 150/90, 115/69, 152/80.   Seen on 12/08/20 for cystitis.  E Coli UT tx with Bactrim DS.  GYNECOLOGIC HISTORY: Patient's last menstrual period was 12/27/2000. Contraception:  Post-menopausal  Menopausal hormone therapy:  none Last mammogram:  08-07-20 density B/BIRADS 1 negative  Last pap smear:   09-22-20 Neg, 09-15-18 Neg, 09-11-16 Neg        OB History    Gravida  3   Para  3   Term      Preterm      AB      Living  3     SAB      IAB      Ectopic      Multiple      Live Births                 Patient Active Problem List   Diagnosis Date Noted  . History of total knee replacement, bilateral 05/12/2018  . Bilateral knee pain 05/12/2018  . Mixed incontinence 09/23/2014    Past Medical History:  Diagnosis Date  . Arthritis    osteoarthritis  . Complication of anesthesia    SLOW TO AWAKE AFTER 2008 SURGERY, PREFERS SPINAL IF POSSIBLE  . Diverticulosis 2001  . Elevated cholesterol   . Hepatitis 1979   HEPATITIS A WITH JAUNDICE- NO ISSUES NOW  . Hypertension   . Varicose veins     Past Surgical History:  Procedure Laterality Date  . cataract surgery  2016 and 2017   bilateral surgery  . COLONOSCOPY WITH PROPOFOL N/A 06/13/2014   Procedure: COLONOSCOPY WITH PROPOFOL;  Surgeon: Cleotis Nipper, MD;  Location: [facility];  Service: Endoscopy;  Laterality: N/A;  . COMPLEX HYPERPLASIC  2002   w/o atypia in an  endocx polyp  . DILATION AND CURETTAGE OF UTERUS  YRS AGO   X 2  . HYSTEROSCOPY  12/2000   D&C  . JOINT REPLACEMENT  12/2011   left knee  . JOINT REPLACEMENT  2008   right knee  . KNEE CLOSED REDUCTION   05/15/2012   Procedure: CLOSED MANIPULATION KNEE;  Surgeon: Gearlean Alf, MD;  Location: [facility];  Service: Orthopedics;  Laterality: Left;  . RIGHT ROTATOR CUFF REPAIR  2005  . RIGHT TOTAL KNEE REPLACMENT  2008  . SQUAMOUS CELL CARCINOMA EXCISION  06/2014   -left leg  . TONSILLECTOMY     at 18 yrs. old  . TOTAL KNEE ARTHROPLASTY  01/19/2012   Procedure: TOTAL KNEE ARTHROPLASTY;  Surgeon: Gearlean Alf, MD;  Location: [facility];  Service: Orthopedics;  Laterality: Left;    Current Outpatient Medications  Medication Sig Dispense Refill  . acetaminophen (TYLENOL) 500 MG tablet Take 1,000 mg by mouth every 6 (six) hours as needed for mild pain, moderate pain, fever or headache.    Marland Kitchen aspirin EC 81 MG tablet Take 81 mg by mouth at bedtime.     Marland Kitchen BIOTIN PO Take 1 tablet by mouth daily.    . Calcium Citrate-Vitamin D (CITRACAL + D PO) Take 2 tablets  by mouth 2 (two) times daily.     . fluorometholone (FML) 0.1 % ophthalmic suspension Place 1 drop into both eyes daily as needed for dry eyes.    Marland Kitchen glucosamine-chondroitin 500-400 MG tablet Take 1 tablet by mouth 2 (two) times daily.     . indapamide (LOZOL) 2.5 MG tablet Take 2.5 mg by mouth daily with breakfast.     . losartan (COZAAR) 25 MG tablet     . mirabegron ER (MYRBETRIQ) 50 MG TB24 tablet Take 1 tablet (50 mg total) by mouth at bedtime. 90 tablet 0  . Multiple Vitamin (MULITIVITAMIN WITH MINERALS) TABS Take 1 tablet by mouth daily with breakfast.     . mupirocin ointment (BACTROBAN) 2 %     . rosuvastatin (CRESTOR) 10 MG tablet Take 10 mg by mouth every Monday, Wednesday, and Friday.     . sulfamethoxazole-trimethoprim (BACTRIM DS) 800-160 MG tablet Take 1 tablet by mouth 2 (two) times daily. One PO BID x 3 days 6 tablet 0  . traZODone (DESYREL) 100 MG tablet Take 1 tablet by mouth at bedtime.    Marland Kitchen venlafaxine XR (EFFEXOR-XR) 37.5 MG 24 hr capsule Take 1 capsule by mouth daily.     No current facility-administered medications for this  visit.     ALLERGIES: Patient has no known allergies.  Family History  Problem Relation Age of Onset  . CVA Mother   . Heart failure Mother   . Heart failure Father   . Breast cancer Sister     Social History   Socioeconomic History  . Marital status: Married    Spouse name: Not on file  . Number of children: Not on file  . Years of education: Not on file  . Highest education level: Not on file  Occupational History  . Not on file  Tobacco Use  . Smoking status: Never Smoker  . Smokeless tobacco: Never Used  Vaping Use  . Vaping Use: Never used  Substance and Sexual Activity  . Alcohol use: Yes    Alcohol/week: 2.0 standard drinks    Types: 2 Glasses of wine per week    Comment: 2glasses of wine a week  . Drug use: No  . Sexual activity: Never    Partners: Male    Birth control/protection: Post-menopausal  Other Topics Concern  . Not on file  Social History Narrative  . Not on file   Social Determinants of Health   Financial Resource Strain: Not on file  Food Insecurity: Not on file  Transportation Needs: Not on file  Physical Activity: Not on file  Stress: Not on file  Social Connections: Not on file  Intimate Partner Violence: Not on file    Review of Systems  All other systems reviewed and are negative.   PHYSICAL EXAMINATION:    BP 140/72 (Cuff Size: Large)   Pulse 63   Ht 5\' 4"  (1.626 m)   Wt 193 lb (87.5 kg)   LMP 12/27/2000   SpO2 100%   BMI 33.13 kg/m     General appearance: alert, cooperative and appears stated age  ASSESSMENT  Overactive bladder.   On Myrbetriq 50 mg and doing well. HTN.  Looks reasonably controlled.   PLAN  Continue Myrbetriq 50 mg daily.  Refills given.  Call for difficulty voiding or bladder infections.  Sees Dr. Virgina Jock in March.  She will continue her blood pressure monitoring at home.  FU in September, 2022 and prn.   25 min  total  time was spent for this patient encounter, including preparation,  face-to-face counseling with the patient, coordination of care, and documentation of the encounter.

## 2021-01-07 ENCOUNTER — Encounter: Payer: Self-pay | Admitting: Obstetrics and Gynecology

## 2021-01-07 ENCOUNTER — Ambulatory Visit: Payer: PPO | Admitting: Obstetrics and Gynecology

## 2021-01-07 ENCOUNTER — Other Ambulatory Visit: Payer: Self-pay

## 2021-01-07 VITALS — BP 140/72 | HR 63 | Ht 64.0 in | Wt 193.0 lb

## 2021-01-07 DIAGNOSIS — N3281 Overactive bladder: Secondary | ICD-10-CM | POA: Diagnosis not present

## 2021-01-07 DIAGNOSIS — Z5181 Encounter for therapeutic drug level monitoring: Secondary | ICD-10-CM | POA: Diagnosis not present

## 2021-01-07 MED ORDER — MIRABEGRON ER 50 MG PO TB24: 50.0000 mg | ORAL_TABLET | Freq: Every day | ORAL | 1 refills | Status: DC

## 2021-03-10 DIAGNOSIS — E785 Hyperlipidemia, unspecified: Secondary | ICD-10-CM | POA: Diagnosis not present

## 2021-03-17 DIAGNOSIS — Z1389 Encounter for screening for other disorder: Secondary | ICD-10-CM | POA: Diagnosis not present

## 2021-03-17 DIAGNOSIS — D72819 Decreased white blood cell count, unspecified: Secondary | ICD-10-CM | POA: Diagnosis not present

## 2021-03-17 DIAGNOSIS — E785 Hyperlipidemia, unspecified: Secondary | ICD-10-CM | POA: Diagnosis not present

## 2021-03-17 DIAGNOSIS — Z0000 Encounter for general adult medical examination without abnormal findings: Secondary | ICD-10-CM | POA: Diagnosis not present

## 2021-03-17 DIAGNOSIS — R82998 Other abnormal findings in urine: Secondary | ICD-10-CM | POA: Diagnosis not present

## 2021-03-17 DIAGNOSIS — H00012 Hordeolum externum right lower eyelid: Secondary | ICD-10-CM | POA: Diagnosis not present

## 2021-03-17 DIAGNOSIS — E669 Obesity, unspecified: Secondary | ICD-10-CM | POA: Diagnosis not present

## 2021-03-17 DIAGNOSIS — Z23 Encounter for immunization: Secondary | ICD-10-CM | POA: Diagnosis not present

## 2021-03-17 DIAGNOSIS — N1831 Chronic kidney disease, stage 3a: Secondary | ICD-10-CM | POA: Diagnosis not present

## 2021-03-17 DIAGNOSIS — I1310 Hypertensive heart and chronic kidney disease without heart failure, with stage 1 through stage 4 chronic kidney disease, or unspecified chronic kidney disease: Secondary | ICD-10-CM | POA: Diagnosis not present

## 2021-03-17 DIAGNOSIS — Z1331 Encounter for screening for depression: Secondary | ICD-10-CM | POA: Diagnosis not present

## 2021-03-17 DIAGNOSIS — N3941 Urge incontinence: Secondary | ICD-10-CM | POA: Diagnosis not present

## 2021-03-17 DIAGNOSIS — D692 Other nonthrombocytopenic purpura: Secondary | ICD-10-CM | POA: Diagnosis not present

## 2021-03-17 DIAGNOSIS — M8580 Other specified disorders of bone density and structure, unspecified site: Secondary | ICD-10-CM | POA: Diagnosis not present

## 2021-03-17 DIAGNOSIS — I872 Venous insufficiency (chronic) (peripheral): Secondary | ICD-10-CM | POA: Diagnosis not present

## 2021-03-17 DIAGNOSIS — F418 Other specified anxiety disorders: Secondary | ICD-10-CM | POA: Diagnosis not present

## 2021-03-20 DIAGNOSIS — H00011 Hordeolum externum right upper eyelid: Secondary | ICD-10-CM | POA: Diagnosis not present

## 2021-04-03 DIAGNOSIS — L723 Sebaceous cyst: Secondary | ICD-10-CM | POA: Diagnosis not present

## 2021-04-03 DIAGNOSIS — M7130 Other bursal cyst, unspecified site: Secondary | ICD-10-CM | POA: Diagnosis not present

## 2021-04-03 DIAGNOSIS — L821 Other seborrheic keratosis: Secondary | ICD-10-CM | POA: Diagnosis not present

## 2021-04-03 DIAGNOSIS — D692 Other nonthrombocytopenic purpura: Secondary | ICD-10-CM | POA: Diagnosis not present

## 2021-04-03 DIAGNOSIS — L817 Pigmented purpuric dermatosis: Secondary | ICD-10-CM | POA: Diagnosis not present

## 2021-04-03 DIAGNOSIS — L819 Disorder of pigmentation, unspecified: Secondary | ICD-10-CM | POA: Diagnosis not present

## 2021-04-03 DIAGNOSIS — Z85828 Personal history of other malignant neoplasm of skin: Secondary | ICD-10-CM | POA: Diagnosis not present

## 2021-04-03 DIAGNOSIS — L820 Inflamed seborrheic keratosis: Secondary | ICD-10-CM | POA: Diagnosis not present

## 2021-04-15 DIAGNOSIS — H52203 Unspecified astigmatism, bilateral: Secondary | ICD-10-CM | POA: Diagnosis not present

## 2021-04-15 DIAGNOSIS — H0011 Chalazion right upper eyelid: Secondary | ICD-10-CM | POA: Diagnosis not present

## 2021-04-15 DIAGNOSIS — Z961 Presence of intraocular lens: Secondary | ICD-10-CM | POA: Diagnosis not present

## 2021-06-02 DIAGNOSIS — R609 Edema, unspecified: Secondary | ICD-10-CM | POA: Diagnosis not present

## 2021-06-02 DIAGNOSIS — I1310 Hypertensive heart and chronic kidney disease without heart failure, with stage 1 through stage 4 chronic kidney disease, or unspecified chronic kidney disease: Secondary | ICD-10-CM | POA: Diagnosis not present

## 2021-06-02 DIAGNOSIS — N3941 Urge incontinence: Secondary | ICD-10-CM | POA: Diagnosis not present

## 2021-06-10 DIAGNOSIS — L720 Epidermal cyst: Secondary | ICD-10-CM | POA: Diagnosis not present

## 2021-06-10 DIAGNOSIS — D485 Neoplasm of uncertain behavior of skin: Secondary | ICD-10-CM | POA: Diagnosis not present

## 2021-06-10 DIAGNOSIS — Z85828 Personal history of other malignant neoplasm of skin: Secondary | ICD-10-CM | POA: Diagnosis not present

## 2021-06-10 DIAGNOSIS — C44729 Squamous cell carcinoma of skin of left lower limb, including hip: Secondary | ICD-10-CM | POA: Diagnosis not present

## 2021-06-20 ENCOUNTER — Encounter (HOSPITAL_BASED_OUTPATIENT_CLINIC_OR_DEPARTMENT_OTHER): Payer: Self-pay

## 2021-06-20 ENCOUNTER — Emergency Department (HOSPITAL_BASED_OUTPATIENT_CLINIC_OR_DEPARTMENT_OTHER): Payer: PPO

## 2021-06-20 ENCOUNTER — Other Ambulatory Visit: Payer: Self-pay

## 2021-06-20 DIAGNOSIS — W010XXA Fall on same level from slipping, tripping and stumbling without subsequent striking against object, initial encounter: Secondary | ICD-10-CM | POA: Diagnosis not present

## 2021-06-20 DIAGNOSIS — I10 Essential (primary) hypertension: Secondary | ICD-10-CM | POA: Insufficient documentation

## 2021-06-20 DIAGNOSIS — Z23 Encounter for immunization: Secondary | ICD-10-CM | POA: Diagnosis not present

## 2021-06-20 DIAGNOSIS — S60212A Contusion of left wrist, initial encounter: Secondary | ICD-10-CM | POA: Diagnosis not present

## 2021-06-20 DIAGNOSIS — Z7982 Long term (current) use of aspirin: Secondary | ICD-10-CM | POA: Insufficient documentation

## 2021-06-20 DIAGNOSIS — S60222A Contusion of left hand, initial encounter: Secondary | ICD-10-CM | POA: Diagnosis not present

## 2021-06-20 DIAGNOSIS — Z96653 Presence of artificial knee joint, bilateral: Secondary | ICD-10-CM | POA: Insufficient documentation

## 2021-06-20 DIAGNOSIS — Z85828 Personal history of other malignant neoplasm of skin: Secondary | ICD-10-CM | POA: Diagnosis not present

## 2021-06-20 DIAGNOSIS — Y92481 Parking lot as the place of occurrence of the external cause: Secondary | ICD-10-CM | POA: Diagnosis not present

## 2021-06-20 DIAGNOSIS — Z79899 Other long term (current) drug therapy: Secondary | ICD-10-CM | POA: Diagnosis not present

## 2021-06-20 DIAGNOSIS — S50812A Abrasion of left forearm, initial encounter: Secondary | ICD-10-CM | POA: Insufficient documentation

## 2021-06-20 DIAGNOSIS — S60812A Abrasion of left wrist, initial encounter: Secondary | ICD-10-CM | POA: Diagnosis not present

## 2021-06-20 DIAGNOSIS — S6992XA Unspecified injury of left wrist, hand and finger(s), initial encounter: Secondary | ICD-10-CM | POA: Diagnosis not present

## 2021-06-20 NOTE — ED Triage Notes (Addendum)
Pt was walking in the parking lot and tripped over cement and fell onto left wrist at 1000 this morning. Pt only c/o left wrist pain. Denies any other injuries or pain.

## 2021-06-21 ENCOUNTER — Emergency Department (HOSPITAL_BASED_OUTPATIENT_CLINIC_OR_DEPARTMENT_OTHER)
Admission: EM | Admit: 2021-06-21 | Discharge: 2021-06-21 | Disposition: A | Discharge status: Home / Self Care | Payer: PPO | Attending: Emergency Medicine | Admitting: Emergency Medicine

## 2021-06-21 DIAGNOSIS — S6992XA Unspecified injury of left wrist, hand and finger(s), initial encounter: Secondary | ICD-10-CM | POA: Diagnosis not present

## 2021-06-21 DIAGNOSIS — W010XXA Fall on same level from slipping, tripping and stumbling without subsequent striking against object, initial encounter: Secondary | ICD-10-CM

## 2021-06-21 DIAGNOSIS — S60812A Abrasion of left wrist, initial encounter: Secondary | ICD-10-CM

## 2021-06-21 DIAGNOSIS — S60222A Contusion of left hand, initial encounter: Secondary | ICD-10-CM

## 2021-06-21 DIAGNOSIS — S50812A Abrasion of left forearm, initial encounter: Secondary | ICD-10-CM

## 2021-06-21 MED ORDER — TETANUS-DIPHTH-ACELL PERTUSSIS 5-2.5-18.5 LF-MCG/0.5 IM SUSY
0.5000 mL | PREFILLED_SYRINGE | Freq: Once | INTRAMUSCULAR | Status: AC
  Administered 2021-06-21: 0.5 mL via INTRAMUSCULAR
  Filled 2021-06-21: qty 0.5

## 2021-06-21 NOTE — ED Provider Notes (Signed)
[facility] Provider Note: Georgena Spurling, MD, FACEP  CSN: [id number] MRN: [id number] ARRIVAL: 06/20/21 at 2214 ROOM: [facility]   HISTORY OF PRESENT ILLNESS  06/21/21 2:24 AM [name] is a 87 y.o. female who tripped in a parking lot and fell yesterday morning about 10 AM.  She fell onto her outstretched left hand.  She is having pain in her left primarily in the thenar eminence.  She rates the pain as an 8 out of 10, characterizes it as throbbing, and the pain is worse with movement of the thumb.  There is associated swelling and ecchymosis primarily of the thenar eminence.  She has 2 associated abrasions of her left forearm.  One is just proximal to the left wrist, the other closer to the elbow.  She denies any injury elsewhere.  She has been applying ice to help reduce the swelling.   Past Medical History:  Diagnosis Date   Arthritis    osteoarthritis   Complication of anesthesia    SLOW TO AWAKE AFTER 2008 SURGERY, PREFERS SPINAL IF POSSIBLE   Diverticulosis 2001   Elevated cholesterol    Hepatitis 1979   HEPATITIS A WITH JAUNDICE- NO ISSUES NOW   Hypertension    Varicose veins     Past Surgical History:  Procedure Laterality Date   cataract surgery  2016 and 2017   bilateral surgery   COLONOSCOPY WITH PROPOFOL N/A 06/13/2014   Procedure: COLONOSCOPY WITH PROPOFOL;  Surgeon: Cleotis Nipper, MD;  Location: [facility];  Service: Endoscopy;  Laterality: N/A;   COMPLEX HYPERPLASIC  2002   w/o atypia in an  endocx polyp   DILATION AND CURETTAGE OF UTERUS  YRS AGO   X 2   HYSTEROSCOPY  12/2000   D&C   JOINT REPLACEMENT  12/2011   left knee   JOINT REPLACEMENT  2008   right knee   KNEE CLOSED REDUCTION  05/15/2012   Procedure: CLOSED MANIPULATION KNEE;  Surgeon: Gearlean Alf, MD;  Location: [facility];  Service: Orthopedics;  Laterality: Left;   RIGHT ROTATOR CUFF REPAIR  2005   RIGHT TOTAL KNEE REPLACMENT  2008    SQUAMOUS CELL CARCINOMA EXCISION  06/2014   -left leg   TONSILLECTOMY     at 18 yrs. old   TOTAL KNEE ARTHROPLASTY  01/19/2012   Procedure: TOTAL KNEE ARTHROPLASTY;  Surgeon: Gearlean Alf, MD;  Location: [facility];  Service: Orthopedics;  Laterality: Left;    Family History  Problem Relation Age of Onset   CVA Mother    Heart failure Mother    Heart failure Father    Breast cancer Sister     Social History   Tobacco Use   Smoking status: Never   Smokeless tobacco: Never  Vaping Use   Vaping Use: Never used  Substance Use Topics   Alcohol use: Yes    Alcohol/week: 2.0 standard drinks    Types: 2 Glasses of wine per week    Comment: 2glasses of wine a week   Drug use: No    Prior to Admission medications   Medication Sig Start Date End Date Taking? Authorizing Provider  acetaminophen (TYLENOL) 500 MG tablet Take 1,000 mg by mouth every 6 (six) hours as needed for mild pain, moderate pain, fever or headache.    [provider]  aspirin EC 81 MG tablet Take 81 mg by mouth at bedtime.     [provider]  BIOTIN PO Take 1 tablet by mouth daily.    [provider]  Calcium Citrate-Vitamin D (CITRACAL + D PO) Take 2 tablets by mouth 2 (two) times daily.     [provider]  fluorometholone (FML) 0.1 % ophthalmic suspension Place 1 drop into both eyes daily as needed for dry eyes. 10/29/16   [provider]  glucosamine-chondroitin 500-400 MG tablet Take 1 tablet by mouth 2 (two) times daily.     [provider]  indapamide (LOZOL) 2.5 MG tablet Take 2.5 mg by mouth daily with breakfast.     [provider]  losartan (COZAAR) 25 MG tablet  09/05/18   [provider]  mirabegron ER (MYRBETRIQ) 50 MG TB24 tablet Take 1 tablet (50 mg total) by mouth at bedtime. 01/07/21   Nunzio Cobbs, MD  Multiple Vitamin (MULITIVITAMIN WITH MINERALS) TABS Take 1 tablet by mouth daily with breakfast.     [provider]  rosuvastatin (CRESTOR) 10 MG tablet Take 10 mg by mouth every Monday, Wednesday, and Friday.     [provider]  traZODone (DESYREL) 100 MG tablet Take 1 tablet by mouth at bedtime. 06/22/16   [provider]  venlafaxine XR (EFFEXOR-XR) 37.5 MG 24 hr capsule Take 1 capsule by mouth daily. 09/08/17   [provider]    Allergies Patient has no known allergies.   REVIEW OF SYSTEMS  Negative except as noted here or in the History of Present Illness.   PHYSICAL EXAMINATION  Initial Vital Signs Blood pressure (!) 188/92, pulse 80, temperature 98.2 F (36.8 C), temperature source Oral, resp. rate 20, height 5\' 5"  (1.651 m), weight 85.7 kg, last menstrual period 12/27/2000, SpO2 94 %.  Examination General: Well-developed, well-nourished female in no acute distress; appearance consistent with age of record HENT: normocephalic; atraumatic Eyes: pupils equal, round and reactive to light; extraocular muscles intact; bilateral pseudophakia Neck: supple; nontender Heart: regular rate and rhythm Lungs: clear to auscultation bilaterally Abdomen: soft; nondistended; nontender; bowel sounds present Extremities: No deformity; full range of motion; swelling and ecchymosis of left thenar eminence ecchymosis of the hand and wrist; pain on movement of left thumb or with palpation of the left thenar eminence but no snuffbox tenderness and no pain on flexion or extension of the wrist, left hand is neurovascularly intact with intact tendon function:    Neurologic: Awake, alert and oriented; motor function intact in all extremities and symmetric; no facial droop Skin: Warm and dry; superficial abrasions to left forearm Psychiatric: Normal mood and affect   RESULTS  Summary of this visit's results, reviewed and interpreted by myself:   EKG Interpretation  Date/Time:    Ventricular Rate:    PR Interval:    QRS Duration:   QT Interval:    QTC Calculation:    R Axis:     Text Interpretation:          Laboratory Studies: No results found for this or any previous visit (from the past 24 hour(s)). Imaging Studies: DG Wrist Complete Left  Result Date: 06/21/2021 CLINICAL DATA:  Fall, left wrist injury EXAM: LEFT WRIST - COMPLETE 3+ VIEW COMPARISON:  None. FINDINGS: Four view radiograph left wrist demonstrates normal alignment. No fracture or dislocation. There is advanced degenerative arthritis noted at the first [facility] joint at the base of the thumb heterotopic ossification within the soft tissues lateral to the first [facility] joint likely relate to remote trauma. Mild degenerate arthritis at the first  MCP joint. Remaining joint spaces are preserved. Soft tissues are unremarkable. IMPRESSION: No acute fracture or dislocation. Advanced degenerative arthritis at the base of the thumb. Electronically Signed   By: Fidela Salisbury MD   On: 06/21/2021 01:06    ED COURSE and MDM  Nursing notes, initial and subsequent vitals signs, including pulse oximetry, reviewed and interpreted by myself.  Vitals:   06/20/21 2226 06/20/21 2227 06/21/21 0150  BP: (!) 122/95  (!) 188/92  Pulse: 93  80  Resp: 17  20  Temp: 98.2 F (36.8 C)    TempSrc: Oral    SpO2: 98%  94%  Weight:  85.7 kg   Height:  5\' 5"  (1.651 m)    Medications  Tdap (BOOSTRIX) injection 0.5 mL (has no administration in time range)    The radiographs show no fracture and her lack of snuffbox tenderness is reassuring regarding an occult scaphoid fracture.  The hematoma should resolve on its own.  We will place her in a Velcro splint for comfort.  PROCEDURES  Procedures   ED DIAGNOSES     ICD-10-CM   1. Fall from slip, trip, or stumble, initial encounter  W01.0XXA     2. Traumatic hematoma of left hand, initial encounter  S60.222A     3. Abrasion of left wrist, initial encounter  S60.812A     4. Abrasion of left forearm, initial encounter  Z61.096E          Shanon Rosser,  MD 06/21/21 [phone]

## 2021-06-30 DIAGNOSIS — C44729 Squamous cell carcinoma of skin of left lower limb, including hip: Secondary | ICD-10-CM | POA: Diagnosis not present

## 2021-06-30 DIAGNOSIS — L738 Other specified follicular disorders: Secondary | ICD-10-CM | POA: Diagnosis not present

## 2021-07-01 DIAGNOSIS — S63602A Unspecified sprain of left thumb, initial encounter: Secondary | ICD-10-CM | POA: Diagnosis not present

## 2021-07-03 ENCOUNTER — Other Ambulatory Visit: Payer: Self-pay | Admitting: Internal Medicine

## 2021-07-03 DIAGNOSIS — Z1231 Encounter for screening mammogram for malignant neoplasm of breast: Secondary | ICD-10-CM

## 2021-07-30 ENCOUNTER — Other Ambulatory Visit: Payer: Self-pay | Admitting: Obstetrics and Gynecology

## 2021-07-30 NOTE — Telephone Encounter (Signed)
Refill request Myrbetriq 50 mg. Last annual with Dr. Quincy Simmonds 09/22/2020 I will send to provider for approval/denial

## 2021-08-18 DIAGNOSIS — I1310 Hypertensive heart and chronic kidney disease without heart failure, with stage 1 through stage 4 chronic kidney disease, or unspecified chronic kidney disease: Secondary | ICD-10-CM | POA: Diagnosis not present

## 2021-08-18 DIAGNOSIS — I8310 Varicose veins of unspecified lower extremity with inflammation: Secondary | ICD-10-CM | POA: Diagnosis not present

## 2021-08-18 DIAGNOSIS — N1831 Chronic kidney disease, stage 3a: Secondary | ICD-10-CM | POA: Diagnosis not present

## 2021-08-18 DIAGNOSIS — R609 Edema, unspecified: Secondary | ICD-10-CM | POA: Diagnosis not present

## 2021-08-26 ENCOUNTER — Other Ambulatory Visit: Payer: Self-pay

## 2021-08-26 ENCOUNTER — Ambulatory Visit
Admission: RE | Admit: 2021-08-26 | Discharge: 2021-08-26 | Disposition: A | Discharge status: Home / Self Care | Payer: PPO | Source: Ambulatory Visit | Attending: Internal Medicine | Admitting: Internal Medicine

## 2021-08-26 DIAGNOSIS — Z1231 Encounter for screening mammogram for malignant neoplasm of breast: Secondary | ICD-10-CM | POA: Diagnosis not present

## 2021-09-22 DIAGNOSIS — N1831 Chronic kidney disease, stage 3a: Secondary | ICD-10-CM | POA: Diagnosis not present

## 2021-09-22 DIAGNOSIS — I1310 Hypertensive heart and chronic kidney disease without heart failure, with stage 1 through stage 4 chronic kidney disease, or unspecified chronic kidney disease: Secondary | ICD-10-CM | POA: Diagnosis not present

## 2021-09-22 DIAGNOSIS — Z23 Encounter for immunization: Secondary | ICD-10-CM | POA: Diagnosis not present

## 2021-09-22 DIAGNOSIS — E785 Hyperlipidemia, unspecified: Secondary | ICD-10-CM | POA: Diagnosis not present

## 2021-09-22 DIAGNOSIS — F418 Other specified anxiety disorders: Secondary | ICD-10-CM | POA: Diagnosis not present

## 2021-09-22 DIAGNOSIS — R600 Localized edema: Secondary | ICD-10-CM | POA: Diagnosis not present

## 2021-09-22 DIAGNOSIS — I872 Venous insufficiency (chronic) (peripheral): Secondary | ICD-10-CM | POA: Diagnosis not present

## 2021-09-22 DIAGNOSIS — D696 Thrombocytopenia, unspecified: Secondary | ICD-10-CM | POA: Diagnosis not present

## 2021-09-22 DIAGNOSIS — M1990 Unspecified osteoarthritis, unspecified site: Secondary | ICD-10-CM | POA: Diagnosis not present

## 2021-09-22 DIAGNOSIS — R946 Abnormal results of thyroid function studies: Secondary | ICD-10-CM | POA: Diagnosis not present

## 2021-09-22 DIAGNOSIS — E669 Obesity, unspecified: Secondary | ICD-10-CM | POA: Diagnosis not present

## 2021-09-22 DIAGNOSIS — M8580 Other specified disorders of bone density and structure, unspecified site: Secondary | ICD-10-CM | POA: Diagnosis not present

## 2021-09-22 DIAGNOSIS — M8589 Other specified disorders of bone density and structure, multiple sites: Secondary | ICD-10-CM | POA: Diagnosis not present

## 2021-09-22 DIAGNOSIS — F325 Major depressive disorder, single episode, in full remission: Secondary | ICD-10-CM | POA: Diagnosis not present

## 2021-09-22 NOTE — Progress Notes (Signed)
GYNECOLOGY  VISIT   HPI: 87 y.o.   Married  Caucasian  female   G3P3 with Patient's last menstrual period was 12/27/2000.   here for breast and pelvic exam.   She is also followed for overactive bladder.  She takes Myrbetriq 50 mg daily.  No urgency but some urinary incontinence.  This is helping her.   Sleeping well.   Has foot swelling.   Saw her PCP yesterday.   GYNECOLOGIC HISTORY: Patient's last menstrual period was 12/27/2000. Contraception: PMP Menopausal hormone therapy:  none Last mammogram: 08-26-21 3D/Neg/Birads1 Last pap smear:  09-22-20 Neg, 09-15-18 Neg, 09-11-16 Neg        OB History     Gravida  3   Para  3   Term      Preterm      AB      Living  3      SAB      IAB      Ectopic      Multiple      Live Births                 Patient Active Problem List   Diagnosis Date Noted   History of total knee replacement, bilateral 05/12/2018   Bilateral knee pain 05/12/2018   Mixed incontinence 09/23/2014    Past Medical History:  Diagnosis Date   Arthritis    osteoarthritis   Complication of anesthesia    SLOW TO AWAKE AFTER 2008 SURGERY, PREFERS SPINAL IF POSSIBLE   Diverticulosis 2001   Elevated cholesterol    Hepatitis 1979   HEPATITIS A WITH JAUNDICE- NO ISSUES NOW   Hypertension    Varicose veins     Past Surgical History:  Procedure Laterality Date   cataract surgery  2016 and 2017   bilateral surgery   COLONOSCOPY WITH PROPOFOL N/A 06/13/2014   Procedure: COLONOSCOPY WITH PROPOFOL;  Surgeon: Cleotis Nipper, MD;  Location: [facility];  Service: Endoscopy;  Laterality: N/A;   COMPLEX HYPERPLASIC  2002   w/o atypia in an  endocx polyp   DILATION AND CURETTAGE OF UTERUS  YRS AGO   X 2   HYSTEROSCOPY  12/2000   D&C   JOINT REPLACEMENT  12/2011   left knee   JOINT REPLACEMENT  2008   right knee   KNEE CLOSED REDUCTION  05/15/2012   Procedure: CLOSED MANIPULATION KNEE;  Surgeon: Gearlean Alf, MD;  Location: [facility];   Service: Orthopedics;  Laterality: Left;   RIGHT ROTATOR CUFF REPAIR  2005   RIGHT TOTAL KNEE REPLACMENT  2008   SQUAMOUS CELL CARCINOMA EXCISION  06/2014   -left leg   TONSILLECTOMY     at 18 yrs. old   TOTAL KNEE ARTHROPLASTY  01/19/2012   Procedure: TOTAL KNEE ARTHROPLASTY;  Surgeon: Gearlean Alf, MD;  Location: [facility];  Service: Orthopedics;  Laterality: Left;    Current Outpatient Medications  Medication Sig Dispense Refill   acetaminophen (TYLENOL) 500 MG tablet Take 1,000 mg by mouth every 6 (six) hours as needed for mild pain, moderate pain, fever or headache.     BIOTIN PO Take 1 tablet by mouth daily.     Calcium Citrate-Vitamin D (CITRACAL + D PO) Take 2 tablets by mouth 2 (two) times daily.      fluorometholone (FML) 0.1 % ophthalmic suspension Place 1 drop into both eyes daily as needed for dry eyes.     furosemide (LASIX) 20 MG tablet Take  20 mg by mouth daily as needed.     glucosamine-chondroitin 500-400 MG tablet Take 1 tablet by mouth 2 (two) times daily.      indapamide (LOZOL) 2.5 MG tablet Take 2.5 mg by mouth daily with breakfast.      losartan (COZAAR) 25 MG tablet      Multiple Vitamin (MULITIVITAMIN WITH MINERALS) TABS Take 1 tablet by mouth daily with breakfast.      MYRBETRIQ 50 MG TB24 tablet TAKE ONE TABLET AT BEDTIME 90 tablet 0   rosuvastatin (CRESTOR) 10 MG tablet Take 10 mg by mouth every Monday, Wednesday, and Friday.      traZODone (DESYREL) 100 MG tablet Take 1 tablet by mouth at bedtime.     venlafaxine XR (EFFEXOR-XR) 37.5 MG 24 hr capsule Take 1 capsule by mouth daily.     No current facility-administered medications for this visit.     ALLERGIES: Patient has no known allergies.  Family History  Problem Relation Age of Onset   CVA Mother    Heart failure Mother    Heart failure Father    Breast cancer Sister     Social History   Socioeconomic History   Marital status: Married    Spouse name: Not on file   Number of children: Not on  file   Years of education: Not on file   Highest education level: Not on file  Occupational History   Not on file  Tobacco Use   Smoking status: Never   Smokeless tobacco: Never  Vaping Use   Vaping Use: Never used  Substance and Sexual Activity   Alcohol use: Not Currently    Comment: a glass of wine occ   Drug use: No   Sexual activity: Not Currently    Partners: Male    Birth control/protection: Post-menopausal  Other Topics Concern   Not on file  Social History Narrative   Not on file   Social Determinants of Health   Financial Resource Strain: Not on file  Food Insecurity: Not on file  Transportation Needs: Not on file  Physical Activity: Not on file  Stress: Not on file  Social Connections: Not on file  Intimate Partner Violence: Not on file    Review of Systems  All other systems reviewed and are negative.  PHYSICAL EXAMINATION:    BP 130/68   Pulse 81   Ht 5' 2.5" (1.588 m)   Wt 190 lb (86.2 kg)   LMP 12/27/2000   SpO2 96%   BMI 34.20 kg/m     General appearance: alert, cooperative and appears stated age Head: Normocephalic, without obvious abnormality, atraumatic Neck: no adenopathy, supple, symmetrical, trachea midline and thyroid normal to inspection and palpation Lungs: clear to auscultation bilaterally Breasts: normal appearance, no masses or tenderness, No nipple retraction or dimpling, No nipple discharge or bleeding, No axillary or supraclavicular adenopathy Heart: regular rate and rhythm Abdomen: soft, non-tender, no masses,  no organomegaly Extremities: extremities normal, atraumatic, no cyanosis or edema Skin: Skin color, texture, turgor normal. No rashes or lesions Lymph nodes: Cervical, supraclavicular, and axillary nodes normal. No abnormal inguinal nodes palpated Neurologic: Grossly normal  Pelvic: External genitalia:  no lesions              Urethra:  normal appearing urethra with no masses, tenderness or lesions               Bartholins and Skenes: normal  Vagina: normal appearing vagina with normal color and discharge, no lesions              Cervix: no lesions.  No pap.                Bimanual Exam:  Uterus:  normal size, contour, position, consistency, mobility, non-tender              Adnexa: no mass, fullness, tenderness              Rectal exam: yes.  Confirms.              Anus:  normal sphincter tone, no lesions  Chaperone was present for exam:  Estill Bamberg, CMA.  ASSESSMENT  Well woman visit with GYN exam.  Osteopenia.  Off Fosamax. Overactive bladder.   controlled with Myrbetriq.  FH breast cancer in sister.   PLAN  Patient and I agree that she will discontinue paps.  Continue yearly mammogram.  Refill of Myrbetriq 50 mg nightly.  #90, RF 3.  Fu for yearly visit.    An After Visit Summary was printed and given to the patient.   27 min  total time was spent for this patient encounter, including preparation, face-to-face counseling with the patient, coordination of care, and documentation of the encounter.

## 2021-09-23 ENCOUNTER — Encounter: Payer: Self-pay | Admitting: Obstetrics and Gynecology

## 2021-09-23 ENCOUNTER — Other Ambulatory Visit: Payer: Self-pay

## 2021-09-23 ENCOUNTER — Ambulatory Visit (INDEPENDENT_AMBULATORY_CARE_PROVIDER_SITE_OTHER): Payer: PPO | Admitting: Obstetrics and Gynecology

## 2021-09-23 VITALS — BP 130/68 | HR 81 | Ht 62.5 in | Wt 190.0 lb

## 2021-09-23 DIAGNOSIS — Z01419 Encounter for gynecological examination (general) (routine) without abnormal findings: Secondary | ICD-10-CM

## 2021-09-23 DIAGNOSIS — N3281 Overactive bladder: Secondary | ICD-10-CM | POA: Diagnosis not present

## 2021-09-23 MED ORDER — MIRABEGRON ER 50 MG PO TB24: 50.0000 mg | ORAL_TABLET | Freq: Every day | ORAL | 3 refills | Status: DC

## 2021-09-23 NOTE — Patient Instructions (Signed)

## 2021-10-06 ENCOUNTER — Other Ambulatory Visit (HOSPITAL_BASED_OUTPATIENT_CLINIC_OR_DEPARTMENT_OTHER): Payer: Self-pay

## 2021-10-06 ENCOUNTER — Ambulatory Visit: Payer: PPO | Attending: Internal Medicine

## 2021-10-06 DIAGNOSIS — Z23 Encounter for immunization: Secondary | ICD-10-CM

## 2021-10-06 MED ORDER — MODERNA COVID-19 BIVAL BOOSTER 50 MCG/0.5ML IM SUSP
INTRAMUSCULAR | 0 refills | Status: DC
  Filled 2021-10-06: qty 0.5, 1d supply, fill #0

## 2021-10-06 NOTE — Progress Notes (Signed)
   Covid-19 Vaccination Clinic  Name:  [name]    MRN: [id number] DOB: Feb 16, 1932  10/06/2021  [name] was observed post Covid-19 immunization for 15 minutes without incident. She was provided with Vaccine Information Sheet and instruction to access the V-Safe system.   [name] was instructed to call 911 with any severe reactions post vaccine: Difficulty breathing  Swelling of face and throat  A fast heartbeat  A bad rash all over body  Dizziness and weakness

## 2021-10-13 DIAGNOSIS — M8580 Other specified disorders of bone density and structure, unspecified site: Secondary | ICD-10-CM | POA: Diagnosis not present

## 2021-10-13 DIAGNOSIS — I1310 Hypertensive heart and chronic kidney disease without heart failure, with stage 1 through stage 4 chronic kidney disease, or unspecified chronic kidney disease: Secondary | ICD-10-CM | POA: Diagnosis not present

## 2021-10-13 DIAGNOSIS — N1831 Chronic kidney disease, stage 3a: Secondary | ICD-10-CM | POA: Diagnosis not present

## 2021-11-10 DIAGNOSIS — M79673 Pain in unspecified foot: Secondary | ICD-10-CM | POA: Diagnosis not present

## 2021-11-16 DIAGNOSIS — M722 Plantar fascial fibromatosis: Secondary | ICD-10-CM | POA: Diagnosis not present

## 2021-11-16 DIAGNOSIS — M79671 Pain in right foot: Secondary | ICD-10-CM | POA: Diagnosis not present

## 2021-12-15 ENCOUNTER — Ambulatory Visit: Payer: PPO | Admitting: Podiatry

## 2021-12-29 DIAGNOSIS — Z96651 Presence of right artificial knee joint: Secondary | ICD-10-CM | POA: Diagnosis not present

## 2021-12-29 DIAGNOSIS — Z96653 Presence of artificial knee joint, bilateral: Secondary | ICD-10-CM | POA: Diagnosis not present

## 2021-12-29 DIAGNOSIS — Z96652 Presence of left artificial knee joint: Secondary | ICD-10-CM | POA: Diagnosis not present

## 2022-01-08 DIAGNOSIS — R2689 Other abnormalities of gait and mobility: Secondary | ICD-10-CM | POA: Diagnosis not present

## 2022-01-08 DIAGNOSIS — M25561 Pain in right knee: Secondary | ICD-10-CM | POA: Diagnosis not present

## 2022-01-08 DIAGNOSIS — M6389 Disorders of muscle in diseases classified elsewhere, multiple sites: Secondary | ICD-10-CM | POA: Diagnosis not present

## 2022-01-08 DIAGNOSIS — Z96653 Presence of artificial knee joint, bilateral: Secondary | ICD-10-CM | POA: Diagnosis not present

## 2022-01-08 DIAGNOSIS — M67863 Other specified disorders of tendon, right knee: Secondary | ICD-10-CM | POA: Diagnosis not present

## 2022-01-08 DIAGNOSIS — M67864 Other specified disorders of tendon, left knee: Secondary | ICD-10-CM | POA: Diagnosis not present

## 2022-01-08 DIAGNOSIS — R278 Other lack of coordination: Secondary | ICD-10-CM | POA: Diagnosis not present

## 2022-01-08 DIAGNOSIS — M25562 Pain in left knee: Secondary | ICD-10-CM | POA: Diagnosis not present

## 2022-01-11 ENCOUNTER — Encounter: Payer: Self-pay | Admitting: Internal Medicine

## 2022-01-11 ENCOUNTER — Non-Acute Institutional Stay (SKILLED_NURSING_FACILITY): Payer: PPO | Admitting: Internal Medicine

## 2022-01-11 DIAGNOSIS — M6389 Disorders of muscle in diseases classified elsewhere, multiple sites: Secondary | ICD-10-CM | POA: Diagnosis not present

## 2022-01-11 DIAGNOSIS — I10 Essential (primary) hypertension: Secondary | ICD-10-CM | POA: Diagnosis not present

## 2022-01-11 DIAGNOSIS — M67863 Other specified disorders of tendon, right knee: Secondary | ICD-10-CM | POA: Diagnosis not present

## 2022-01-11 DIAGNOSIS — M25562 Pain in left knee: Secondary | ICD-10-CM

## 2022-01-11 DIAGNOSIS — M25561 Pain in right knee: Secondary | ICD-10-CM

## 2022-01-11 DIAGNOSIS — M779 Enthesopathy, unspecified: Secondary | ICD-10-CM

## 2022-01-11 DIAGNOSIS — R2689 Other abnormalities of gait and mobility: Secondary | ICD-10-CM | POA: Diagnosis not present

## 2022-01-11 DIAGNOSIS — E785 Hyperlipidemia, unspecified: Secondary | ICD-10-CM

## 2022-01-11 DIAGNOSIS — M67864 Other specified disorders of tendon, left knee: Secondary | ICD-10-CM | POA: Diagnosis not present

## 2022-01-11 DIAGNOSIS — F32A Depression, unspecified: Secondary | ICD-10-CM

## 2022-01-11 DIAGNOSIS — Z96653 Presence of artificial knee joint, bilateral: Secondary | ICD-10-CM | POA: Diagnosis not present

## 2022-01-11 DIAGNOSIS — N3946 Mixed incontinence: Secondary | ICD-10-CM | POA: Diagnosis not present

## 2022-01-11 DIAGNOSIS — R278 Other lack of coordination: Secondary | ICD-10-CM | POA: Diagnosis not present

## 2022-01-11 NOTE — Progress Notes (Signed)
Provider:  Veleta Miners, MD Location:   [place] Room Number: 158 A Place of Service:  SNF [phone])  PCP: Shon Baton, MD Patient Care Team: Shon Baton, MD as PCP - General (Internal Medicine)  Extended Emergency Contact Information Primary Emergency Contact: [facility] Phone: [phone] Relation: Daughter Secondary Emergency Contact: Kelvin Cellar [place] Mobile Phone: [phone] Relation: Son  Code Status:  Goals of Care: Advanced Directive information Advanced Directives 01/11/2022  Does Patient Have a Medical Advance Directive? Yes  Type of Paramedic of [place];Living will  Does patient want to make changes to medical advance directive? No - Patient declined  Copy of [place] in Chart? Yes - validated most recent copy scanned in chart (See row information)  Pre-existing out of facility DNR order (yellow form or pink MOST form) -      Chief Complaint  Patient presents with   New Admit To SNF    New admission   Health Maintenance    Pneumonia vaccine,dexa scan, COVID vaccine    HPI: Patient is a 88 y.o. female seen today for admission to SNF  Patient admitted in SNF for rehab for bilateral knee pain  Patient has a history of hypertension, urinary incontinence, CKD, osteoarthritis, HLD  Patient also has a history of bilateral Total knee replacement Went to see orthopedics for 1 week history of bilateral knee pain right more than left.   No history of any injury or fall.   Her x-rays were all normal.  Her right knee was still swollen and was diagnosed with Tendinitis of Quadriceps    They suggested brace ice and rest her knee. [place]. Patient has started working with therapy.   She states she has no pain when she is sitting.  Her pain starts when she puts weight on her knee.  But is able to use her walker and  walk with mild assist.    Past Medical History:  Diagnosis Date   Arthritis    osteoarthritis   Complication of anesthesia    SLOW TO AWAKE AFTER 2008 SURGERY, PREFERS SPINAL IF POSSIBLE   Diverticulosis 2001   Elevated cholesterol    Hepatitis 1979   HEPATITIS A WITH JAUNDICE- NO ISSUES NOW   Hypertension    Varicose veins    Past Surgical History:  Procedure Laterality Date   cataract surgery  2016 and 2017   bilateral surgery   COLONOSCOPY WITH PROPOFOL N/A 06/13/2014   Procedure: COLONOSCOPY WITH PROPOFOL;  Surgeon: Cleotis Nipper, MD;  Location: [facility];  Service: Endoscopy;  Laterality: N/A;   COMPLEX HYPERPLASIC  2002   w/o atypia in an  endocx polyp   DILATION AND CURETTAGE OF UTERUS  YRS AGO   X 2   HYSTEROSCOPY  12/2000   D&C   JOINT REPLACEMENT  12/2011   left knee   JOINT REPLACEMENT  2008   right knee   KNEE CLOSED REDUCTION  05/15/2012   Procedure: CLOSED MANIPULATION KNEE;  Surgeon: Gearlean Alf, MD;  Location: [facility];  Service: Orthopedics;  Laterality: Left;   RIGHT ROTATOR CUFF REPAIR  2005   RIGHT TOTAL KNEE REPLACMENT  2008   SQUAMOUS CELL CARCINOMA EXCISION  06/2014   -left leg   TONSILLECTOMY     at 18 yrs. old   TOTAL KNEE ARTHROPLASTY  01/19/2012   Procedure: TOTAL KNEE ARTHROPLASTY;  Surgeon: Dione Plover  Aluisio, MD;  Location: [facility];  Service: Orthopedics;  Laterality: Left;    reports that she has never smoked. She has never used smokeless tobacco. She reports that she does not currently use alcohol. She reports that she does not use drugs. Social History   Socioeconomic History   Marital status: Married    Spouse name: Not on file   Number of children: Not on file   Years of education: Not on file   Highest education level: Not on file  Occupational History   Not on file  Tobacco Use   Smoking status: Never   Smokeless tobacco: Never  Vaping Use   Vaping Use: Never used  Substance and Sexual Activity   Alcohol use: Not  Currently    Comment: a glass of wine occ   Drug use: No   Sexual activity: Not Currently    Partners: Male    Birth control/protection: Post-menopausal  Other Topics Concern   Not on file  Social History Narrative   Not on file   Social Determinants of Health   Financial Resource Strain: Not on file  Food Insecurity: Not on file  Transportation Needs: Not on file  Physical Activity: Not on file  Stress: Not on file  Social Connections: Not on file  Intimate Partner Violence: Not on file    Functional Status Survey:    Family History  Problem Relation Age of Onset   CVA Mother    Heart failure Mother    Heart failure Father    Breast cancer Sister     Health Maintenance  Topic Date Due   DEXA SCAN  Never done   Pneumonia Vaccine 74+ Years old (2 - PPSV23 if available, else PCV20) 08/13/2014   COVID-19 Vaccine (1) 10/06/2021   TETANUS/TDAP  06/22/2031   INFLUENZA VACCINE  Completed   Zoster Vaccines- Shingrix  Completed   HPV VACCINES  Aged Out    No Known Allergies  Allergies as of 01/11/2022   No Known Allergies      Medication List        Accurate as of January 11, 2022 10:20 AM. If you have any questions, ask your nurse or doctor.          STOP taking these medications    Moderna COVID-19 Bival Booster 50 MCG/0.5ML injection Generic drug: COVID-19 mRNA bivalent vaccine (Moderna) Stopped by: Virgie Dad, MD       TAKE these medications    acetaminophen 500 MG tablet Commonly known as: TYLENOL Take 1,000 mg by mouth every 6 (six) hours as needed for mild pain, moderate pain, fever or headache.   BIOTIN PO Take 1 tablet by mouth daily.   CITRACAL + D PO Take 2 tablets by mouth 2 (two) times daily.   fluorometholone 0.1 % ophthalmic suspension Commonly known as: FML Place 1 drop into both eyes daily as needed for dry eyes.   furosemide 20 MG tablet Commonly known as: LASIX Take 20 mg by mouth daily as needed.    glucosamine-chondroitin 500-400 MG tablet Take 1 tablet by mouth 2 (two) times daily.   indapamide 2.5 MG tablet Commonly known as: LOZOL Take 2.5 mg by mouth daily with breakfast.   losartan 25 MG tablet Commonly known as: COZAAR   mirabegron ER 50 MG Tb24 tablet Commonly known as: Myrbetriq Take 1 tablet (50 mg total) by mouth at bedtime.   multivitamin with minerals Tabs tablet Take 1 tablet by mouth daily with breakfast.  rosuvastatin 10 MG tablet Commonly known as: CRESTOR Take 10 mg by mouth every Monday, Wednesday, and Friday.   traZODone 100 MG tablet Commonly known as: DESYREL Take 1 tablet by mouth at bedtime.   venlafaxine XR 37.5 MG 24 hr capsule Commonly known as: EFFEXOR-XR Take 1 capsule by mouth daily.        Review of Systems  Constitutional:  Negative for activity change and appetite change.  HENT: Negative.    Respiratory:  Negative for cough and shortness of breath.   Cardiovascular:  Negative for leg swelling.  Gastrointestinal:  Negative for constipation.  Genitourinary: Negative.   Musculoskeletal:  Positive for arthralgias, gait problem, joint swelling and myalgias.  Skin: Negative.   Neurological:  Negative for dizziness and weakness.  Psychiatric/Behavioral:  Negative for confusion, dysphoric mood and sleep disturbance.    Vitals:   01/11/22 1010  BP: (!) 149/80  Pulse: (!) 57  Resp: 18  Temp: 97.8 F (36.6 C)  SpO2: 99%  Weight: 184 lb 12.8 oz (83.8 kg)  Height: 5' 2.5" (1.588 m)   Body mass index is 33.26 kg/m. Physical Exam Vitals reviewed.  Constitutional:      Appearance: Normal appearance.  HENT:     Head: Normocephalic.     Nose: Nose normal.     Mouth/Throat:     Mouth: Mucous membranes are moist.     Pharynx: Oropharynx is clear.  Eyes:     Pupils: Pupils are equal, round, and reactive to light.  Cardiovascular:     Rate and Rhythm: Normal rate and regular rhythm.     Pulses: Normal pulses.     Heart  sounds: Normal heart sounds. No murmur heard. Pulmonary:     Effort: Pulmonary effort is normal.     Breath sounds: Normal breath sounds.  Abdominal:     General: Abdomen is flat. Bowel sounds are normal.     Palpations: Abdomen is soft.  Musculoskeletal:        General: No swelling.     Cervical back: Neck supple.     Comments: Right knee mildly swollen around the Lateral Meniscal . Not able to Flex without pain. Stiff Left knee exam Normal  Skin:    General: Skin is warm.  Neurological:     General: No focal deficit present.     Mental Status: She is alert and oriented to person, place, and time.  Psychiatric:        Mood and Affect: Mood normal.        Thought Content: Thought content normal.    Labs reviewed: Basic Metabolic Panel: No results for input(s): NA, K, CL, CO2, GLUCOSE, BUN, CREATININE, CALCIUM, MG, PHOS in the last 8760 hours. Liver Function Tests: No results for input(s): AST, ALT, ALKPHOS, BILITOT, PROT, ALBUMIN in the last 8760 hours. No results for input(s): LIPASE, AMYLASE in the last 8760 hours. No results for input(s): AMMONIA in the last 8760 hours. CBC: No results for input(s): WBC, NEUTROABS, HGB, HCT, MCV, PLT in the last 8760 hours. Cardiac Enzymes: No results for input(s): CKTOTAL, CKMB, CKMBINDEX, TROPONINI in the last 8760 hours. BNP: Invalid input(s): POCBNP No results found for: HGBA1C No results found for: TSH No results found for: VITAMINB12 No results found for: FOLATE No results found for: IRON, TIBC, FERRITIN  Imaging and Procedures obtained prior to SNF admission: MM 3D SCREEN BREAST BILATERAL  Result Date: 08/30/2021 CLINICAL DATA:  Screening. EXAM: DIGITAL SCREENING BILATERAL MAMMOGRAM WITH TOMOSYNTHESIS AND CAD TECHNIQUE:  Bilateral screening digital craniocaudal and mediolateral oblique mammograms were obtained. Bilateral screening digital breast tomosynthesis was performed. The images were evaluated with computer-aided detection.  COMPARISON:  Previous exam(s). ACR Breast Density Category b: There are scattered areas of fibroglandular density. FINDINGS: There are no findings suspicious for malignancy. IMPRESSION: No mammographic evidence of malignancy. A result letter of this screening mammogram will be mailed directly to the patient. RECOMMENDATION: Screening mammogram in one year. (Code:SM-B-01Y) BI-RADS CATEGORY  1: Negative. Electronically Signed   By: Lajean Manes M.D.   On: 08/30/2021 18:10    Assessment/Plan 1. Acute pain of both knees Per Ortho due to tendinitis Will try Meloxicam 15 mg QD for 1 week Prilosec 20 mg QD for 1 week  2. Tendinitis Can also use Voltaren Gel Workign with therapy Meloxicam Rest and Ice  3. Mixed incontinence On Myrbetriq  4. Hyperlipidemia, unspecified hyperlipidemia type Continue Crestor  5. Depression, unspecified depression type On Effexor and trazodone  6. Primary hypertension Continue on Cozaar and Low dose Diuretics     Family/ staff Communication:   Labs/tests ordered:

## 2022-01-12 DIAGNOSIS — M67863 Other specified disorders of tendon, right knee: Secondary | ICD-10-CM | POA: Diagnosis not present

## 2022-01-12 DIAGNOSIS — M25562 Pain in left knee: Secondary | ICD-10-CM | POA: Diagnosis not present

## 2022-01-12 DIAGNOSIS — Z96653 Presence of artificial knee joint, bilateral: Secondary | ICD-10-CM | POA: Diagnosis not present

## 2022-01-12 DIAGNOSIS — M25561 Pain in right knee: Secondary | ICD-10-CM | POA: Diagnosis not present

## 2022-01-12 DIAGNOSIS — M6389 Disorders of muscle in diseases classified elsewhere, multiple sites: Secondary | ICD-10-CM | POA: Diagnosis not present

## 2022-01-12 DIAGNOSIS — M67864 Other specified disorders of tendon, left knee: Secondary | ICD-10-CM | POA: Diagnosis not present

## 2022-01-12 DIAGNOSIS — R278 Other lack of coordination: Secondary | ICD-10-CM | POA: Diagnosis not present

## 2022-01-12 DIAGNOSIS — R2689 Other abnormalities of gait and mobility: Secondary | ICD-10-CM | POA: Diagnosis not present

## 2022-01-13 DIAGNOSIS — R278 Other lack of coordination: Secondary | ICD-10-CM | POA: Diagnosis not present

## 2022-01-13 DIAGNOSIS — M67864 Other specified disorders of tendon, left knee: Secondary | ICD-10-CM | POA: Diagnosis not present

## 2022-01-13 DIAGNOSIS — M6389 Disorders of muscle in diseases classified elsewhere, multiple sites: Secondary | ICD-10-CM | POA: Diagnosis not present

## 2022-01-13 DIAGNOSIS — M67863 Other specified disorders of tendon, right knee: Secondary | ICD-10-CM | POA: Diagnosis not present

## 2022-01-13 DIAGNOSIS — M25562 Pain in left knee: Secondary | ICD-10-CM | POA: Diagnosis not present

## 2022-01-13 DIAGNOSIS — M25561 Pain in right knee: Secondary | ICD-10-CM | POA: Diagnosis not present

## 2022-01-13 DIAGNOSIS — Z96653 Presence of artificial knee joint, bilateral: Secondary | ICD-10-CM | POA: Diagnosis not present

## 2022-01-13 DIAGNOSIS — R2689 Other abnormalities of gait and mobility: Secondary | ICD-10-CM | POA: Diagnosis not present

## 2022-01-14 DIAGNOSIS — M67863 Other specified disorders of tendon, right knee: Secondary | ICD-10-CM | POA: Diagnosis not present

## 2022-01-14 DIAGNOSIS — M67864 Other specified disorders of tendon, left knee: Secondary | ICD-10-CM | POA: Diagnosis not present

## 2022-01-14 DIAGNOSIS — Z96653 Presence of artificial knee joint, bilateral: Secondary | ICD-10-CM | POA: Diagnosis not present

## 2022-01-14 DIAGNOSIS — M25562 Pain in left knee: Secondary | ICD-10-CM | POA: Diagnosis not present

## 2022-01-14 DIAGNOSIS — M25561 Pain in right knee: Secondary | ICD-10-CM | POA: Diagnosis not present

## 2022-01-14 DIAGNOSIS — R278 Other lack of coordination: Secondary | ICD-10-CM | POA: Diagnosis not present

## 2022-01-14 DIAGNOSIS — R2689 Other abnormalities of gait and mobility: Secondary | ICD-10-CM | POA: Diagnosis not present

## 2022-01-14 DIAGNOSIS — M6389 Disorders of muscle in diseases classified elsewhere, multiple sites: Secondary | ICD-10-CM | POA: Diagnosis not present

## 2022-01-15 DIAGNOSIS — R278 Other lack of coordination: Secondary | ICD-10-CM | POA: Diagnosis not present

## 2022-01-15 DIAGNOSIS — R2689 Other abnormalities of gait and mobility: Secondary | ICD-10-CM | POA: Diagnosis not present

## 2022-01-15 DIAGNOSIS — M67863 Other specified disorders of tendon, right knee: Secondary | ICD-10-CM | POA: Diagnosis not present

## 2022-01-15 DIAGNOSIS — M25561 Pain in right knee: Secondary | ICD-10-CM | POA: Diagnosis not present

## 2022-01-15 DIAGNOSIS — M6389 Disorders of muscle in diseases classified elsewhere, multiple sites: Secondary | ICD-10-CM | POA: Diagnosis not present

## 2022-01-15 DIAGNOSIS — M67864 Other specified disorders of tendon, left knee: Secondary | ICD-10-CM | POA: Diagnosis not present

## 2022-01-15 DIAGNOSIS — Z96653 Presence of artificial knee joint, bilateral: Secondary | ICD-10-CM | POA: Diagnosis not present

## 2022-01-15 DIAGNOSIS — M25562 Pain in left knee: Secondary | ICD-10-CM | POA: Diagnosis not present

## 2022-01-18 DIAGNOSIS — R278 Other lack of coordination: Secondary | ICD-10-CM | POA: Diagnosis not present

## 2022-01-18 DIAGNOSIS — M6389 Disorders of muscle in diseases classified elsewhere, multiple sites: Secondary | ICD-10-CM | POA: Diagnosis not present

## 2022-01-18 DIAGNOSIS — M67864 Other specified disorders of tendon, left knee: Secondary | ICD-10-CM | POA: Diagnosis not present

## 2022-01-18 DIAGNOSIS — M25561 Pain in right knee: Secondary | ICD-10-CM | POA: Diagnosis not present

## 2022-01-18 DIAGNOSIS — Z96653 Presence of artificial knee joint, bilateral: Secondary | ICD-10-CM | POA: Diagnosis not present

## 2022-01-18 DIAGNOSIS — R2689 Other abnormalities of gait and mobility: Secondary | ICD-10-CM | POA: Diagnosis not present

## 2022-01-18 DIAGNOSIS — M25562 Pain in left knee: Secondary | ICD-10-CM | POA: Diagnosis not present

## 2022-01-18 DIAGNOSIS — M67863 Other specified disorders of tendon, right knee: Secondary | ICD-10-CM | POA: Diagnosis not present

## 2022-01-19 DIAGNOSIS — R2689 Other abnormalities of gait and mobility: Secondary | ICD-10-CM | POA: Diagnosis not present

## 2022-01-19 DIAGNOSIS — M6389 Disorders of muscle in diseases classified elsewhere, multiple sites: Secondary | ICD-10-CM | POA: Diagnosis not present

## 2022-01-19 DIAGNOSIS — R278 Other lack of coordination: Secondary | ICD-10-CM | POA: Diagnosis not present

## 2022-01-19 DIAGNOSIS — M67864 Other specified disorders of tendon, left knee: Secondary | ICD-10-CM | POA: Diagnosis not present

## 2022-01-19 DIAGNOSIS — M67863 Other specified disorders of tendon, right knee: Secondary | ICD-10-CM | POA: Diagnosis not present

## 2022-01-19 DIAGNOSIS — Z96653 Presence of artificial knee joint, bilateral: Secondary | ICD-10-CM | POA: Diagnosis not present

## 2022-01-19 DIAGNOSIS — M25562 Pain in left knee: Secondary | ICD-10-CM | POA: Diagnosis not present

## 2022-01-19 DIAGNOSIS — M25561 Pain in right knee: Secondary | ICD-10-CM | POA: Diagnosis not present

## 2022-01-20 DIAGNOSIS — M25562 Pain in left knee: Secondary | ICD-10-CM | POA: Diagnosis not present

## 2022-01-20 DIAGNOSIS — M67863 Other specified disorders of tendon, right knee: Secondary | ICD-10-CM | POA: Diagnosis not present

## 2022-01-20 DIAGNOSIS — R278 Other lack of coordination: Secondary | ICD-10-CM | POA: Diagnosis not present

## 2022-01-20 DIAGNOSIS — M6389 Disorders of muscle in diseases classified elsewhere, multiple sites: Secondary | ICD-10-CM | POA: Diagnosis not present

## 2022-01-20 DIAGNOSIS — M67864 Other specified disorders of tendon, left knee: Secondary | ICD-10-CM | POA: Diagnosis not present

## 2022-01-20 DIAGNOSIS — R2689 Other abnormalities of gait and mobility: Secondary | ICD-10-CM | POA: Diagnosis not present

## 2022-01-20 DIAGNOSIS — M25561 Pain in right knee: Secondary | ICD-10-CM | POA: Diagnosis not present

## 2022-01-20 DIAGNOSIS — Z96653 Presence of artificial knee joint, bilateral: Secondary | ICD-10-CM | POA: Diagnosis not present

## 2022-01-21 DIAGNOSIS — M25561 Pain in right knee: Secondary | ICD-10-CM | POA: Diagnosis not present

## 2022-01-21 DIAGNOSIS — Z96653 Presence of artificial knee joint, bilateral: Secondary | ICD-10-CM | POA: Diagnosis not present

## 2022-01-21 DIAGNOSIS — R2689 Other abnormalities of gait and mobility: Secondary | ICD-10-CM | POA: Diagnosis not present

## 2022-01-21 DIAGNOSIS — M6389 Disorders of muscle in diseases classified elsewhere, multiple sites: Secondary | ICD-10-CM | POA: Diagnosis not present

## 2022-01-21 DIAGNOSIS — R278 Other lack of coordination: Secondary | ICD-10-CM | POA: Diagnosis not present

## 2022-01-21 DIAGNOSIS — M25562 Pain in left knee: Secondary | ICD-10-CM | POA: Diagnosis not present

## 2022-01-21 DIAGNOSIS — M67863 Other specified disorders of tendon, right knee: Secondary | ICD-10-CM | POA: Diagnosis not present

## 2022-01-21 DIAGNOSIS — M67864 Other specified disorders of tendon, left knee: Secondary | ICD-10-CM | POA: Diagnosis not present

## 2022-01-22 DIAGNOSIS — M67864 Other specified disorders of tendon, left knee: Secondary | ICD-10-CM | POA: Diagnosis not present

## 2022-01-22 DIAGNOSIS — R2689 Other abnormalities of gait and mobility: Secondary | ICD-10-CM | POA: Diagnosis not present

## 2022-01-22 DIAGNOSIS — M67863 Other specified disorders of tendon, right knee: Secondary | ICD-10-CM | POA: Diagnosis not present

## 2022-01-22 DIAGNOSIS — M25562 Pain in left knee: Secondary | ICD-10-CM | POA: Diagnosis not present

## 2022-01-22 DIAGNOSIS — Z96653 Presence of artificial knee joint, bilateral: Secondary | ICD-10-CM | POA: Diagnosis not present

## 2022-01-22 DIAGNOSIS — M25561 Pain in right knee: Secondary | ICD-10-CM | POA: Diagnosis not present

## 2022-01-22 DIAGNOSIS — M6389 Disorders of muscle in diseases classified elsewhere, multiple sites: Secondary | ICD-10-CM | POA: Diagnosis not present

## 2022-01-22 DIAGNOSIS — R278 Other lack of coordination: Secondary | ICD-10-CM | POA: Diagnosis not present

## 2022-01-25 DIAGNOSIS — M6389 Disorders of muscle in diseases classified elsewhere, multiple sites: Secondary | ICD-10-CM | POA: Diagnosis not present

## 2022-01-25 DIAGNOSIS — M25562 Pain in left knee: Secondary | ICD-10-CM | POA: Diagnosis not present

## 2022-01-25 DIAGNOSIS — R2689 Other abnormalities of gait and mobility: Secondary | ICD-10-CM | POA: Diagnosis not present

## 2022-01-25 DIAGNOSIS — M67864 Other specified disorders of tendon, left knee: Secondary | ICD-10-CM | POA: Diagnosis not present

## 2022-01-25 DIAGNOSIS — Z96653 Presence of artificial knee joint, bilateral: Secondary | ICD-10-CM | POA: Diagnosis not present

## 2022-01-25 DIAGNOSIS — M25561 Pain in right knee: Secondary | ICD-10-CM | POA: Diagnosis not present

## 2022-01-25 DIAGNOSIS — M67863 Other specified disorders of tendon, right knee: Secondary | ICD-10-CM | POA: Diagnosis not present

## 2022-01-25 DIAGNOSIS — R278 Other lack of coordination: Secondary | ICD-10-CM | POA: Diagnosis not present

## 2022-01-26 DIAGNOSIS — M67864 Other specified disorders of tendon, left knee: Secondary | ICD-10-CM | POA: Diagnosis not present

## 2022-01-26 DIAGNOSIS — R2689 Other abnormalities of gait and mobility: Secondary | ICD-10-CM | POA: Diagnosis not present

## 2022-01-26 DIAGNOSIS — R278 Other lack of coordination: Secondary | ICD-10-CM | POA: Diagnosis not present

## 2022-01-26 DIAGNOSIS — M25561 Pain in right knee: Secondary | ICD-10-CM | POA: Diagnosis not present

## 2022-01-26 DIAGNOSIS — M6389 Disorders of muscle in diseases classified elsewhere, multiple sites: Secondary | ICD-10-CM | POA: Diagnosis not present

## 2022-01-26 DIAGNOSIS — M25562 Pain in left knee: Secondary | ICD-10-CM | POA: Diagnosis not present

## 2022-01-26 DIAGNOSIS — Z96653 Presence of artificial knee joint, bilateral: Secondary | ICD-10-CM | POA: Diagnosis not present

## 2022-01-26 DIAGNOSIS — M67863 Other specified disorders of tendon, right knee: Secondary | ICD-10-CM | POA: Diagnosis not present

## 2022-01-27 ENCOUNTER — Non-Acute Institutional Stay (SKILLED_NURSING_FACILITY): Payer: PPO | Admitting: Orthopedic Surgery

## 2022-01-27 ENCOUNTER — Encounter: Payer: Self-pay | Admitting: Orthopedic Surgery

## 2022-01-27 DIAGNOSIS — M67863 Other specified disorders of tendon, right knee: Secondary | ICD-10-CM | POA: Diagnosis not present

## 2022-01-27 DIAGNOSIS — M779 Enthesopathy, unspecified: Secondary | ICD-10-CM | POA: Diagnosis not present

## 2022-01-27 DIAGNOSIS — R278 Other lack of coordination: Secondary | ICD-10-CM | POA: Diagnosis not present

## 2022-01-27 DIAGNOSIS — N3946 Mixed incontinence: Secondary | ICD-10-CM | POA: Diagnosis not present

## 2022-01-27 DIAGNOSIS — M25561 Pain in right knee: Secondary | ICD-10-CM | POA: Diagnosis not present

## 2022-01-27 DIAGNOSIS — R2689 Other abnormalities of gait and mobility: Secondary | ICD-10-CM | POA: Diagnosis not present

## 2022-01-27 DIAGNOSIS — M67864 Other specified disorders of tendon, left knee: Secondary | ICD-10-CM | POA: Diagnosis not present

## 2022-01-27 DIAGNOSIS — Z96653 Presence of artificial knee joint, bilateral: Secondary | ICD-10-CM | POA: Diagnosis not present

## 2022-01-27 DIAGNOSIS — F32A Depression, unspecified: Secondary | ICD-10-CM | POA: Insufficient documentation

## 2022-01-27 DIAGNOSIS — E785 Hyperlipidemia, unspecified: Secondary | ICD-10-CM | POA: Diagnosis not present

## 2022-01-27 DIAGNOSIS — M25562 Pain in left knee: Secondary | ICD-10-CM | POA: Diagnosis not present

## 2022-01-27 DIAGNOSIS — I10 Essential (primary) hypertension: Secondary | ICD-10-CM

## 2022-01-27 DIAGNOSIS — M6389 Disorders of muscle in diseases classified elsewhere, multiple sites: Secondary | ICD-10-CM | POA: Diagnosis not present

## 2022-01-27 NOTE — Progress Notes (Signed)
Location:  [facility] Room Number: 158/A Place of Service:  SNF [phone])  Provider: Yvonna Alanis, NP  PCP: Shon Baton, MD Patient Care Team: Shon Baton, MD as PCP - General (Internal Medicine)  Extended Emergency Contact Information Primary Emergency Contact: [facility] Phone: [phone] Relation: Daughter Secondary Emergency Contact: Kelvin Cellar [place] Mobile Phone: [phone] Relation: Son  Code Status: DNR Goals of care:  Advanced Directive information Advanced Directives 01/27/2022  Does Patient Have a Medical Advance Directive? Yes  Type of Advance Directive Living will;Healthcare Power of [place];Out of facility DNR (pink MOST or yellow form)  Does patient want to make changes to medical advance directive? No - Patient declined  Copy of [place] in Chart? Yes - validated most recent copy scanned in chart (See row information)  Pre-existing out of facility DNR order (yellow form or pink MOST form) -     No Known Allergies  Chief Complaint  Patient presents with   Discharge Note    Discharge from [facility].    HPI:  88 y.o. female seen today for discharge.   She currently resides on the rehab unit at [facility] PMH: HTN, HLD, CKD 3a, venous insufficiency, OA, urge incontinence, and depression.   History of bilateral knee replacement 10-12 years ago. 01/09 she was admitted for bilateral knee tendonitis per Dr. Wynelle Link. She was having increased pain up until admission. No reported falls. Xrays taken at [facility] unremarkable. She has been receiving PT/OT during her stay. She has also been wearing a knee brace and icing knees. Today, she denies pain while ambulating. Ambulating 800 ft with rolator. She only reports mild pain when going from sitting to standing position. She is not taking any pain medication at this time. LBM 02/01. [place] She  has home health aid, PT/OT set up. Follow up with [facility] 02/01/2022. Advised to follow up with PCP in 1-2 weeks. She does not need any refills today.    Past Medical History:  Diagnosis Date   Arthritis    osteoarthritis   Complication of anesthesia    SLOW TO AWAKE AFTER 2008 SURGERY, PREFERS SPINAL IF POSSIBLE   Diverticulosis 2001   Elevated cholesterol    Hepatitis 1979   HEPATITIS A WITH JAUNDICE- NO ISSUES NOW   Hypertension    Varicose veins     Past Surgical History:  Procedure Laterality Date   cataract surgery  2016 and 2017   bilateral surgery   COLONOSCOPY WITH PROPOFOL N/A 06/13/2014   Procedure: COLONOSCOPY WITH PROPOFOL;  Surgeon: Cleotis Nipper, MD;  Location: [facility];  Service: Endoscopy;  Laterality: N/A;   COMPLEX HYPERPLASIC  2002   w/o atypia in an  endocx polyp   DILATION AND CURETTAGE OF UTERUS  YRS AGO   X 2   HYSTEROSCOPY  12/2000   D&C   JOINT REPLACEMENT  12/2011   left knee   JOINT REPLACEMENT  2008   right knee   KNEE CLOSED REDUCTION  05/15/2012   Procedure: CLOSED MANIPULATION KNEE;  Surgeon: Gearlean Alf, MD;  Location: [facility];  Service: Orthopedics;  Laterality: Left;   RIGHT ROTATOR CUFF REPAIR  2005   RIGHT TOTAL KNEE REPLACMENT  2008   SQUAMOUS CELL CARCINOMA EXCISION  06/2014   -left leg   TONSILLECTOMY     at 18 yrs. old   TOTAL KNEE ARTHROPLASTY  01/19/2012   Procedure:  TOTAL KNEE ARTHROPLASTY;  Surgeon: Gearlean Alf, MD;  Location: [facility];  Service: Orthopedics;  Laterality: Left;      reports that she has never smoked. She has never used smokeless tobacco. She reports that she does not currently use alcohol. She reports that she does not use drugs. Social History   Socioeconomic History   Marital status: Married    Spouse name: Not on file   Number of children: Not on file   Years of education: Not on file   Highest education level: Not on file  Occupational History   Not on file  Tobacco Use   Smoking  status: Never   Smokeless tobacco: Never  Vaping Use   Vaping Use: Never used  Substance and Sexual Activity   Alcohol use: Not Currently    Comment: a glass of wine occ   Drug use: No   Sexual activity: Not Currently    Partners: Male    Birth control/protection: Post-menopausal  Other Topics Concern   Not on file  Social History Narrative   Not on file   Social Determinants of Health   Financial Resource Strain: Not on file  Food Insecurity: Not on file  Transportation Needs: Not on file  Physical Activity: Not on file  Stress: Not on file  Social Connections: Not on file  Intimate Partner Violence: Not on file   Functional Status Survey:    No Known Allergies  Pertinent  Health Maintenance Due  Topic Date Due   DEXA SCAN  Never done   INFLUENZA VACCINE  Completed    Medications: Outpatient Encounter Medications as of 01/27/2022  Medication Sig   acetaminophen (TYLENOL) 500 MG tablet Take 1,000 mg by mouth every 6 (six) hours as needed for mild pain, moderate pain, fever or headache.   BIOTIN PO Take 1 tablet by mouth daily.   Calcium Citrate-Vitamin D (CITRACAL + D PO) Take 2 tablets by mouth 2 (two) times daily.    fluorometholone (FML) 0.1 % ophthalmic suspension Place 1 drop into both eyes daily as needed for dry eyes.   furosemide (LASIX) 20 MG tablet Take 20 mg by mouth daily as needed.   glucosamine-chondroitin 500-400 MG tablet Take 1 tablet by mouth 2 (two) times daily.    indapamide (LOZOL) 2.5 MG tablet Take 2.5 mg by mouth daily with breakfast.    losartan (COZAAR) 25 MG tablet    mirabegron ER (MYRBETRIQ) 50 MG TB24 tablet Take 1 tablet (50 mg total) by mouth at bedtime.   Multiple Vitamin (MULITIVITAMIN WITH MINERALS) TABS Take 1 tablet by mouth daily with breakfast.    rosuvastatin (CRESTOR) 10 MG tablet Take 10 mg by mouth every Monday, Wednesday, and Friday.    traZODone (DESYREL) 100 MG tablet Take 1 tablet by mouth at bedtime.   venlafaxine XR  (EFFEXOR-XR) 37.5 MG 24 hr capsule Take 1 capsule by mouth daily.   No facility-administered encounter medications on file as of 01/27/2022.    Review of Systems  Constitutional:  Negative for activity change, appetite change, chills, fatigue and fever.  HENT:  Negative for congestion and trouble swallowing.   Eyes:  Negative for visual disturbance.  Respiratory:  Negative for cough, shortness of breath and wheezing.   Cardiovascular:  Negative for chest pain and leg swelling.  Gastrointestinal:  Negative for abdominal distention, abdominal pain, constipation, diarrhea and nausea.  Genitourinary:  Positive for frequency. Negative for dysuria and hematuria.  Musculoskeletal:  Positive for arthralgias and gait  problem.  Skin:  Negative for wound.  Neurological:  Positive for weakness. Negative for dizziness and headaches.  Psychiatric/Behavioral:  Positive for dysphoric mood. Negative for confusion and sleep disturbance. The patient is not nervous/anxious.    Vitals:   01/27/22 1129  BP: (!) 151/85  Pulse: 76  Resp: 14  Temp: 97.9 F (36.6 C)  SpO2: 98%  Weight: 185 lb 12.8 oz (84.3 kg)  Height: 5\' 5"  (1.651 m)   Body mass index is 30.92 kg/m. Physical Exam Vitals reviewed.  Constitutional:      General: She is not in acute distress. HENT:     Head: Normocephalic.  Eyes:     General:        Right eye: No discharge.        Left eye: No discharge.  Neck:     Vascular: No carotid bruit.  Cardiovascular:     Rate and Rhythm: Normal rate and regular rhythm.     Pulses: Normal pulses.     Heart sounds: Normal heart sounds. No murmur heard. Pulmonary:     Effort: Pulmonary effort is normal. No respiratory distress.     Breath sounds: Normal breath sounds. No wheezing.  Abdominal:     General: Bowel sounds are normal. There is no distension.     Palpations: Abdomen is soft.     Tenderness: There is no abdominal tenderness.  Musculoskeletal:     Cervical back: Normal range  of motion.     Right knee: No swelling, deformity or erythema. Normal range of motion. No tenderness.     Left knee: No swelling, deformity or erythema. Normal range of motion. No tenderness.     Right lower leg: Edema present.     Left lower leg: Edema present.     Comments: Non pitting  Lymphadenopathy:     Cervical: No cervical adenopathy.  Skin:    General: Skin is warm and dry.     Capillary Refill: Capillary refill takes less than 2 seconds.  Neurological:     General: No focal deficit present.     Mental Status: She is alert and oriented to person, place, and time.     Motor: Weakness present.     Gait: Gait abnormal.     Comments: rolator  Psychiatric:        Mood and Affect: Mood normal.        Behavior: Behavior normal.    Labs reviewed: Basic Metabolic Panel: No results for input(s): NA, K, CL, CO2, GLUCOSE, BUN, CREATININE, CALCIUM, MG, PHOS in the last 8760 hours. Liver Function Tests: No results for input(s): AST, ALT, ALKPHOS, BILITOT, PROT, ALBUMIN in the last 8760 hours. No results for input(s): LIPASE, AMYLASE in the last 8760 hours. No results for input(s): AMMONIA in the last 8760 hours. CBC: No results for input(s): WBC, NEUTROABS, HGB, HCT, MCV, PLT in the last 8760 hours. Cardiac Enzymes: No results for input(s): CKTOTAL, CKMB, CKMBINDEX, TROPONINI in the last 8760 hours. BNP: Invalid input(s): POCBNP CBG: No results for input(s): GLUCAP in the last 8760 hours.  Procedures and Imaging Studies During Stay: No results found.  Assessment/Plan:   1. Tendinitis - followed by Dr. Wynelle Link - admitted to [facility] rehab 01/09 - ambulating 800 ft with rolator - cont using knee brace - cont icing knee - may use tylenol prn for pain - home health PT/OT  2. Primary hypertension - controlled - cont losartan  3. Hyperlipidemia, unspecified hyperlipidemia type - cont Crestor  4. Mixed incontinence - cont Myrbetriq  5. Depression, unspecified  depression type - no mood changes - cont trazodone and Effexor   Patient is being discharged with the following home health services:  PT/OT  Patient is being discharged with the following durable medical equipment:  none  Patient has been advised to f/u with their PCP in 1-2 weeks to for a transitions of care visit.  Social services at their facility was responsible for arranging this appointment.  Pt was provided with adequate prescriptions of noncontrolled medications to reach the scheduled appointment .  For controlled substances, a limited supply was provided as appropriate for the individual patient.  If the pt normally receives these medications from a pain clinic or has a contract with another physician, these medications should be received from that clinic or physician only).    Future labs/tests needed:  none

## 2022-01-28 DIAGNOSIS — M6389 Disorders of muscle in diseases classified elsewhere, multiple sites: Secondary | ICD-10-CM | POA: Diagnosis not present

## 2022-01-28 DIAGNOSIS — M67863 Other specified disorders of tendon, right knee: Secondary | ICD-10-CM | POA: Diagnosis not present

## 2022-01-28 DIAGNOSIS — M67864 Other specified disorders of tendon, left knee: Secondary | ICD-10-CM | POA: Diagnosis not present

## 2022-01-28 DIAGNOSIS — R278 Other lack of coordination: Secondary | ICD-10-CM | POA: Diagnosis not present

## 2022-01-28 DIAGNOSIS — M25562 Pain in left knee: Secondary | ICD-10-CM | POA: Diagnosis not present

## 2022-01-28 DIAGNOSIS — R2689 Other abnormalities of gait and mobility: Secondary | ICD-10-CM | POA: Diagnosis not present

## 2022-01-28 DIAGNOSIS — Z96653 Presence of artificial knee joint, bilateral: Secondary | ICD-10-CM | POA: Diagnosis not present

## 2022-01-28 DIAGNOSIS — M25561 Pain in right knee: Secondary | ICD-10-CM | POA: Diagnosis not present

## 2022-01-29 DIAGNOSIS — M67863 Other specified disorders of tendon, right knee: Secondary | ICD-10-CM | POA: Diagnosis not present

## 2022-01-29 DIAGNOSIS — M67864 Other specified disorders of tendon, left knee: Secondary | ICD-10-CM | POA: Diagnosis not present

## 2022-01-29 DIAGNOSIS — R2689 Other abnormalities of gait and mobility: Secondary | ICD-10-CM | POA: Diagnosis not present

## 2022-01-29 DIAGNOSIS — M6389 Disorders of muscle in diseases classified elsewhere, multiple sites: Secondary | ICD-10-CM | POA: Diagnosis not present

## 2022-01-29 DIAGNOSIS — M25562 Pain in left knee: Secondary | ICD-10-CM | POA: Diagnosis not present

## 2022-01-29 DIAGNOSIS — M25561 Pain in right knee: Secondary | ICD-10-CM | POA: Diagnosis not present

## 2022-01-29 DIAGNOSIS — Z96653 Presence of artificial knee joint, bilateral: Secondary | ICD-10-CM | POA: Diagnosis not present

## 2022-01-29 DIAGNOSIS — R278 Other lack of coordination: Secondary | ICD-10-CM | POA: Diagnosis not present

## 2022-02-01 DIAGNOSIS — Z96653 Presence of artificial knee joint, bilateral: Secondary | ICD-10-CM | POA: Diagnosis not present

## 2022-02-02 DIAGNOSIS — Z96653 Presence of artificial knee joint, bilateral: Secondary | ICD-10-CM | POA: Diagnosis not present

## 2022-02-02 DIAGNOSIS — M67864 Other specified disorders of tendon, left knee: Secondary | ICD-10-CM | POA: Diagnosis not present

## 2022-02-02 DIAGNOSIS — R278 Other lack of coordination: Secondary | ICD-10-CM | POA: Diagnosis not present

## 2022-02-02 DIAGNOSIS — M25562 Pain in left knee: Secondary | ICD-10-CM | POA: Diagnosis not present

## 2022-02-02 DIAGNOSIS — M25561 Pain in right knee: Secondary | ICD-10-CM | POA: Diagnosis not present

## 2022-02-02 DIAGNOSIS — M6389 Disorders of muscle in diseases classified elsewhere, multiple sites: Secondary | ICD-10-CM | POA: Diagnosis not present

## 2022-02-02 DIAGNOSIS — M67863 Other specified disorders of tendon, right knee: Secondary | ICD-10-CM | POA: Diagnosis not present

## 2022-02-02 DIAGNOSIS — R2689 Other abnormalities of gait and mobility: Secondary | ICD-10-CM | POA: Diagnosis not present

## 2022-02-03 DIAGNOSIS — M67864 Other specified disorders of tendon, left knee: Secondary | ICD-10-CM | POA: Diagnosis not present

## 2022-02-03 DIAGNOSIS — R2689 Other abnormalities of gait and mobility: Secondary | ICD-10-CM | POA: Diagnosis not present

## 2022-02-03 DIAGNOSIS — M67863 Other specified disorders of tendon, right knee: Secondary | ICD-10-CM | POA: Diagnosis not present

## 2022-02-03 DIAGNOSIS — M6389 Disorders of muscle in diseases classified elsewhere, multiple sites: Secondary | ICD-10-CM | POA: Diagnosis not present

## 2022-02-03 DIAGNOSIS — Z96653 Presence of artificial knee joint, bilateral: Secondary | ICD-10-CM | POA: Diagnosis not present

## 2022-02-03 DIAGNOSIS — M25562 Pain in left knee: Secondary | ICD-10-CM | POA: Diagnosis not present

## 2022-02-03 DIAGNOSIS — R278 Other lack of coordination: Secondary | ICD-10-CM | POA: Diagnosis not present

## 2022-02-03 DIAGNOSIS — M25561 Pain in right knee: Secondary | ICD-10-CM | POA: Diagnosis not present

## 2022-02-04 DIAGNOSIS — M67863 Other specified disorders of tendon, right knee: Secondary | ICD-10-CM | POA: Diagnosis not present

## 2022-02-04 DIAGNOSIS — Z96653 Presence of artificial knee joint, bilateral: Secondary | ICD-10-CM | POA: Diagnosis not present

## 2022-02-04 DIAGNOSIS — M67864 Other specified disorders of tendon, left knee: Secondary | ICD-10-CM | POA: Diagnosis not present

## 2022-02-04 DIAGNOSIS — M25562 Pain in left knee: Secondary | ICD-10-CM | POA: Diagnosis not present

## 2022-02-04 DIAGNOSIS — R278 Other lack of coordination: Secondary | ICD-10-CM | POA: Diagnosis not present

## 2022-02-04 DIAGNOSIS — M25561 Pain in right knee: Secondary | ICD-10-CM | POA: Diagnosis not present

## 2022-02-04 DIAGNOSIS — M6389 Disorders of muscle in diseases classified elsewhere, multiple sites: Secondary | ICD-10-CM | POA: Diagnosis not present

## 2022-02-04 DIAGNOSIS — R2689 Other abnormalities of gait and mobility: Secondary | ICD-10-CM | POA: Diagnosis not present

## 2022-02-05 DIAGNOSIS — M6389 Disorders of muscle in diseases classified elsewhere, multiple sites: Secondary | ICD-10-CM | POA: Diagnosis not present

## 2022-02-05 DIAGNOSIS — R278 Other lack of coordination: Secondary | ICD-10-CM | POA: Diagnosis not present

## 2022-02-05 DIAGNOSIS — M25562 Pain in left knee: Secondary | ICD-10-CM | POA: Diagnosis not present

## 2022-02-05 DIAGNOSIS — R2689 Other abnormalities of gait and mobility: Secondary | ICD-10-CM | POA: Diagnosis not present

## 2022-02-05 DIAGNOSIS — M67864 Other specified disorders of tendon, left knee: Secondary | ICD-10-CM | POA: Diagnosis not present

## 2022-02-05 DIAGNOSIS — Z96653 Presence of artificial knee joint, bilateral: Secondary | ICD-10-CM | POA: Diagnosis not present

## 2022-02-05 DIAGNOSIS — M25561 Pain in right knee: Secondary | ICD-10-CM | POA: Diagnosis not present

## 2022-02-05 DIAGNOSIS — M67863 Other specified disorders of tendon, right knee: Secondary | ICD-10-CM | POA: Diagnosis not present

## 2022-02-08 DIAGNOSIS — M25561 Pain in right knee: Secondary | ICD-10-CM | POA: Diagnosis not present

## 2022-02-08 DIAGNOSIS — M67863 Other specified disorders of tendon, right knee: Secondary | ICD-10-CM | POA: Diagnosis not present

## 2022-02-08 DIAGNOSIS — M25562 Pain in left knee: Secondary | ICD-10-CM | POA: Diagnosis not present

## 2022-02-08 DIAGNOSIS — Z96653 Presence of artificial knee joint, bilateral: Secondary | ICD-10-CM | POA: Diagnosis not present

## 2022-02-08 DIAGNOSIS — R2689 Other abnormalities of gait and mobility: Secondary | ICD-10-CM | POA: Diagnosis not present

## 2022-02-08 DIAGNOSIS — R278 Other lack of coordination: Secondary | ICD-10-CM | POA: Diagnosis not present

## 2022-02-08 DIAGNOSIS — M67864 Other specified disorders of tendon, left knee: Secondary | ICD-10-CM | POA: Diagnosis not present

## 2022-02-08 DIAGNOSIS — M6389 Disorders of muscle in diseases classified elsewhere, multiple sites: Secondary | ICD-10-CM | POA: Diagnosis not present

## 2022-02-09 DIAGNOSIS — M67864 Other specified disorders of tendon, left knee: Secondary | ICD-10-CM | POA: Diagnosis not present

## 2022-02-09 DIAGNOSIS — M67863 Other specified disorders of tendon, right knee: Secondary | ICD-10-CM | POA: Diagnosis not present

## 2022-02-09 DIAGNOSIS — M25562 Pain in left knee: Secondary | ICD-10-CM | POA: Diagnosis not present

## 2022-02-09 DIAGNOSIS — Z96653 Presence of artificial knee joint, bilateral: Secondary | ICD-10-CM | POA: Diagnosis not present

## 2022-02-09 DIAGNOSIS — M25561 Pain in right knee: Secondary | ICD-10-CM | POA: Diagnosis not present

## 2022-02-09 DIAGNOSIS — M6389 Disorders of muscle in diseases classified elsewhere, multiple sites: Secondary | ICD-10-CM | POA: Diagnosis not present

## 2022-02-09 DIAGNOSIS — R278 Other lack of coordination: Secondary | ICD-10-CM | POA: Diagnosis not present

## 2022-02-09 DIAGNOSIS — R2689 Other abnormalities of gait and mobility: Secondary | ICD-10-CM | POA: Diagnosis not present

## 2022-02-16 DIAGNOSIS — M67864 Other specified disorders of tendon, left knee: Secondary | ICD-10-CM | POA: Diagnosis not present

## 2022-02-16 DIAGNOSIS — M67863 Other specified disorders of tendon, right knee: Secondary | ICD-10-CM | POA: Diagnosis not present

## 2022-02-16 DIAGNOSIS — M25562 Pain in left knee: Secondary | ICD-10-CM | POA: Diagnosis not present

## 2022-02-16 DIAGNOSIS — R278 Other lack of coordination: Secondary | ICD-10-CM | POA: Diagnosis not present

## 2022-02-16 DIAGNOSIS — Z96653 Presence of artificial knee joint, bilateral: Secondary | ICD-10-CM | POA: Diagnosis not present

## 2022-02-16 DIAGNOSIS — M6389 Disorders of muscle in diseases classified elsewhere, multiple sites: Secondary | ICD-10-CM | POA: Diagnosis not present

## 2022-02-16 DIAGNOSIS — M25561 Pain in right knee: Secondary | ICD-10-CM | POA: Diagnosis not present

## 2022-02-16 DIAGNOSIS — R2689 Other abnormalities of gait and mobility: Secondary | ICD-10-CM | POA: Diagnosis not present

## 2022-02-17 DIAGNOSIS — L570 Actinic keratosis: Secondary | ICD-10-CM | POA: Diagnosis not present

## 2022-02-17 DIAGNOSIS — L821 Other seborrheic keratosis: Secondary | ICD-10-CM | POA: Diagnosis not present

## 2022-02-17 DIAGNOSIS — L820 Inflamed seborrheic keratosis: Secondary | ICD-10-CM | POA: Diagnosis not present

## 2022-02-17 DIAGNOSIS — D1801 Hemangioma of skin and subcutaneous tissue: Secondary | ICD-10-CM | POA: Diagnosis not present

## 2022-02-17 DIAGNOSIS — I872 Venous insufficiency (chronic) (peripheral): Secondary | ICD-10-CM | POA: Diagnosis not present

## 2022-02-17 DIAGNOSIS — Z85828 Personal history of other malignant neoplasm of skin: Secondary | ICD-10-CM | POA: Diagnosis not present

## 2022-02-17 DIAGNOSIS — L814 Other melanin hyperpigmentation: Secondary | ICD-10-CM | POA: Diagnosis not present

## 2022-02-17 DIAGNOSIS — I8311 Varicose veins of right lower extremity with inflammation: Secondary | ICD-10-CM | POA: Diagnosis not present

## 2022-02-17 DIAGNOSIS — B351 Tinea unguium: Secondary | ICD-10-CM | POA: Diagnosis not present

## 2022-02-17 DIAGNOSIS — I8312 Varicose veins of left lower extremity with inflammation: Secondary | ICD-10-CM | POA: Diagnosis not present

## 2022-02-17 DIAGNOSIS — D485 Neoplasm of uncertain behavior of skin: Secondary | ICD-10-CM | POA: Diagnosis not present

## 2022-02-24 DIAGNOSIS — R278 Other lack of coordination: Secondary | ICD-10-CM | POA: Diagnosis not present

## 2022-02-24 DIAGNOSIS — M6389 Disorders of muscle in diseases classified elsewhere, multiple sites: Secondary | ICD-10-CM | POA: Diagnosis not present

## 2022-02-24 DIAGNOSIS — Z96653 Presence of artificial knee joint, bilateral: Secondary | ICD-10-CM | POA: Diagnosis not present

## 2022-02-24 DIAGNOSIS — M67864 Other specified disorders of tendon, left knee: Secondary | ICD-10-CM | POA: Diagnosis not present

## 2022-02-24 DIAGNOSIS — R2689 Other abnormalities of gait and mobility: Secondary | ICD-10-CM | POA: Diagnosis not present

## 2022-02-24 DIAGNOSIS — M67863 Other specified disorders of tendon, right knee: Secondary | ICD-10-CM | POA: Diagnosis not present

## 2022-03-10 DIAGNOSIS — M67864 Other specified disorders of tendon, left knee: Secondary | ICD-10-CM | POA: Diagnosis not present

## 2022-03-10 DIAGNOSIS — M6389 Disorders of muscle in diseases classified elsewhere, multiple sites: Secondary | ICD-10-CM | POA: Diagnosis not present

## 2022-03-10 DIAGNOSIS — R2689 Other abnormalities of gait and mobility: Secondary | ICD-10-CM | POA: Diagnosis not present

## 2022-03-10 DIAGNOSIS — R278 Other lack of coordination: Secondary | ICD-10-CM | POA: Diagnosis not present

## 2022-03-10 DIAGNOSIS — M67863 Other specified disorders of tendon, right knee: Secondary | ICD-10-CM | POA: Diagnosis not present

## 2022-03-10 DIAGNOSIS — Z96653 Presence of artificial knee joint, bilateral: Secondary | ICD-10-CM | POA: Diagnosis not present

## 2022-03-13 IMAGING — MG MM DIGITAL SCREENING BILAT W/ TOMO AND CAD
6 of 10 series · 6 of 30 positions shown · non-contrast
Comparison: Previous exam(s).

CLINICAL DATA: Screening.

EXAM:
DIGITAL SCREENING BILATERAL MAMMOGRAM WITH TOMOSYNTHESIS AND CAD
TECHNIQUE: Bilateral screening digital craniocaudal and mediolateral oblique
mammograms were obtained. Bilateral screening digital breast
tomosynthesis was performed. The images were evaluated with
computer-aided detection.

[L MLO synth-2D]
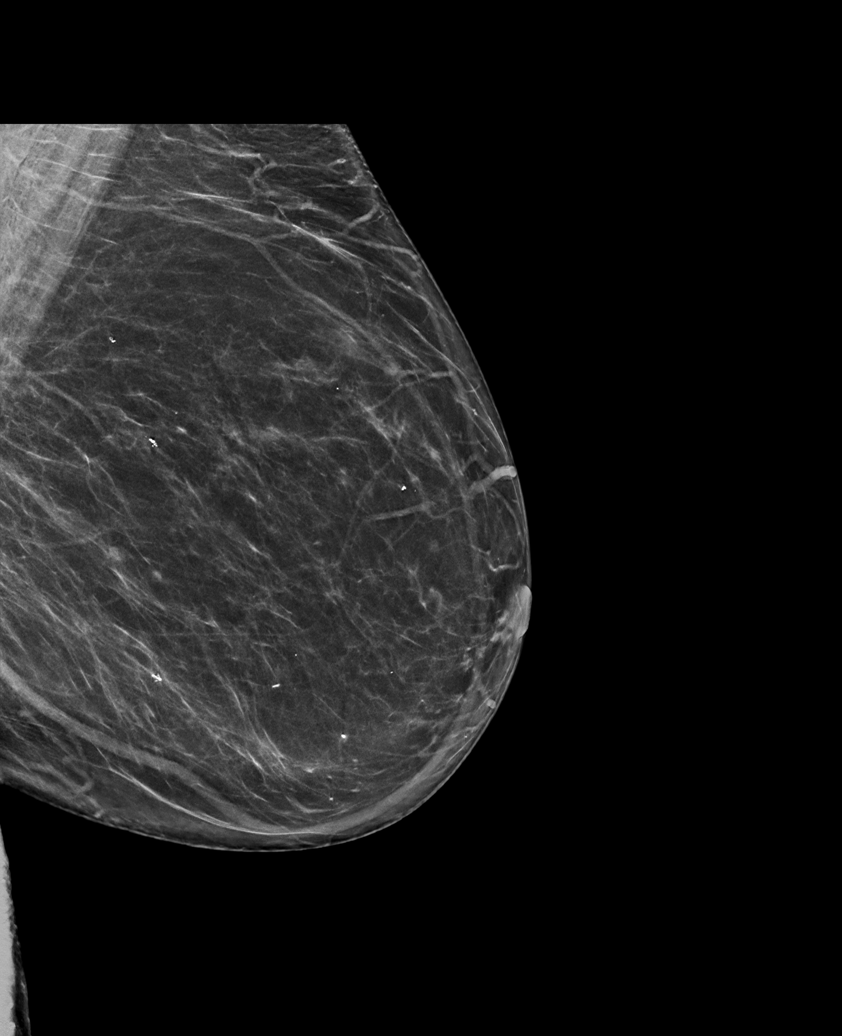

[R MLO synth-2D (1 of 2)]
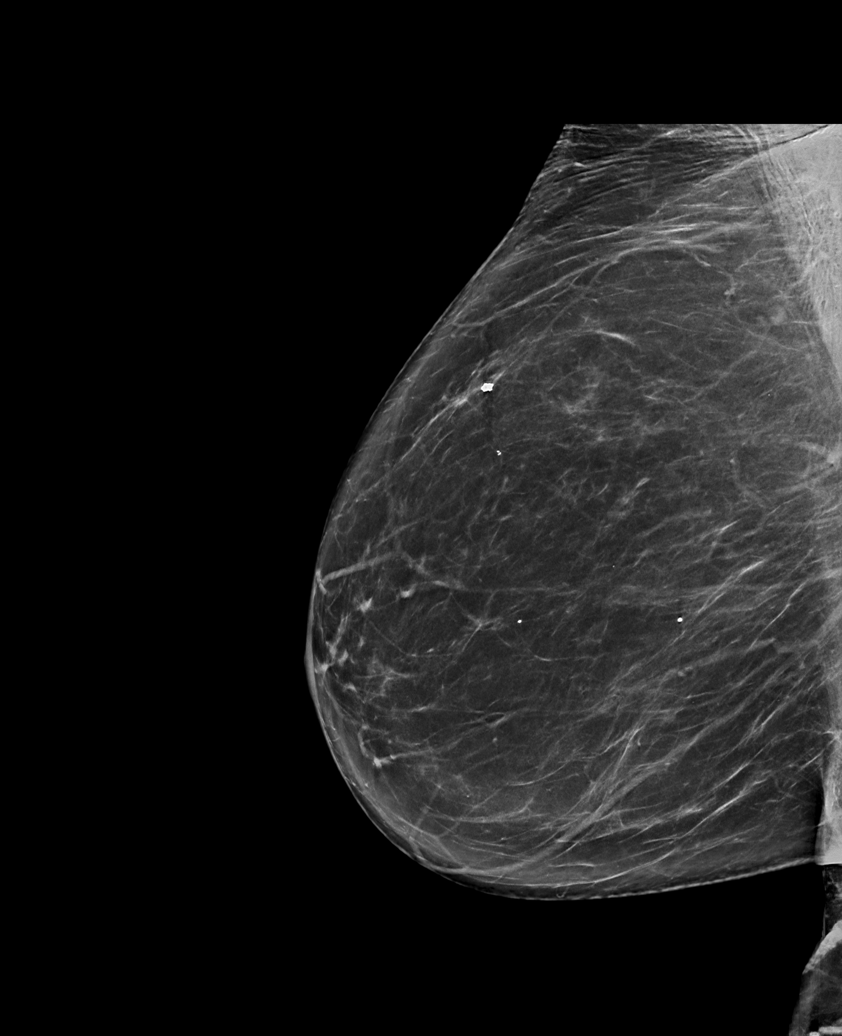

[R CC synth-2D]
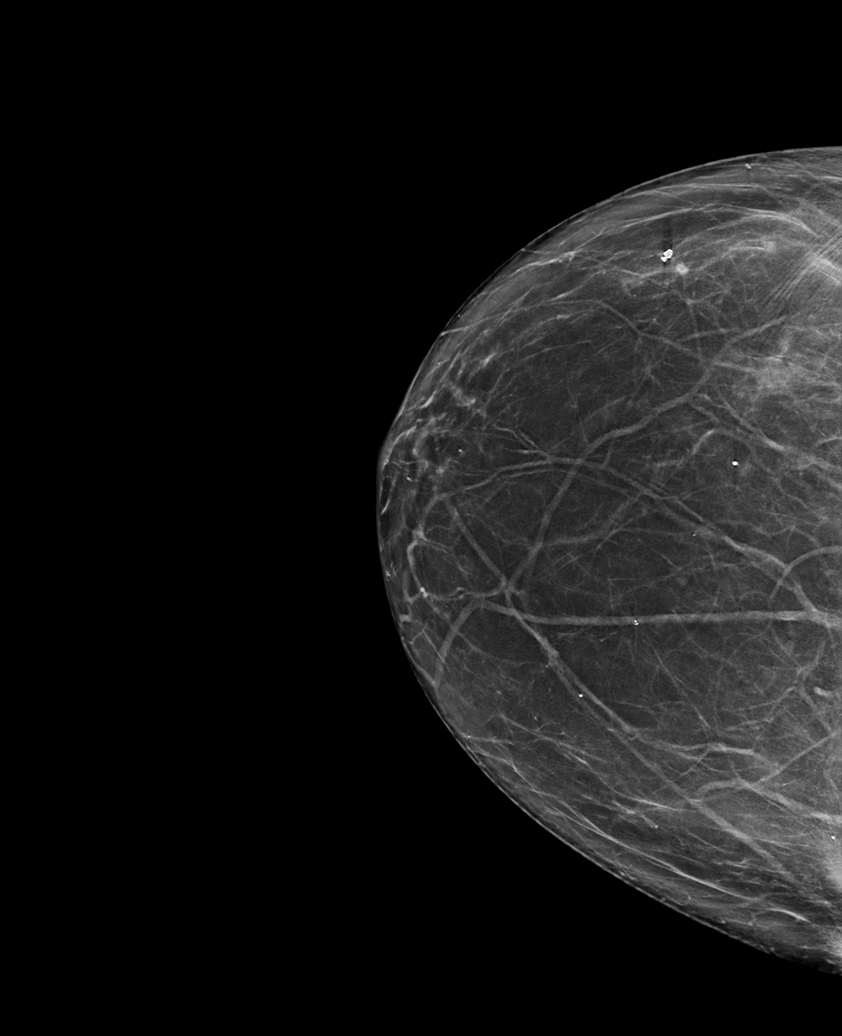

[R MLO synth-2D (2 of 2)]
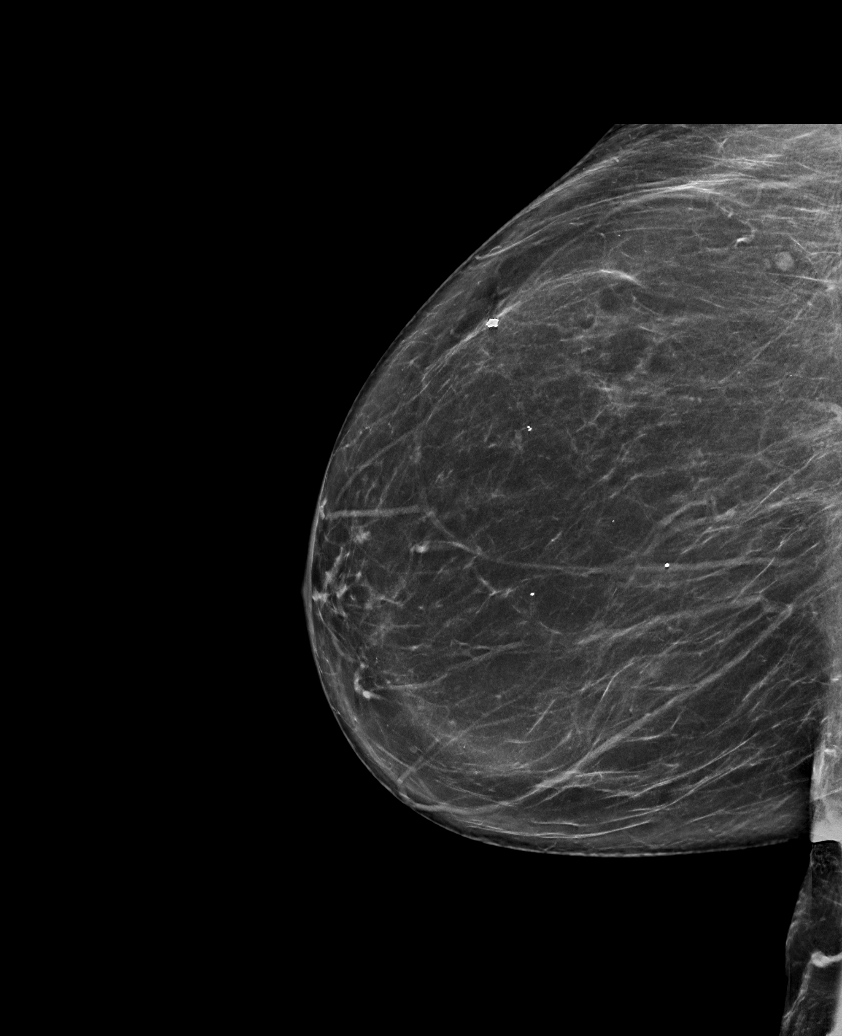

[L CC synth-2D]
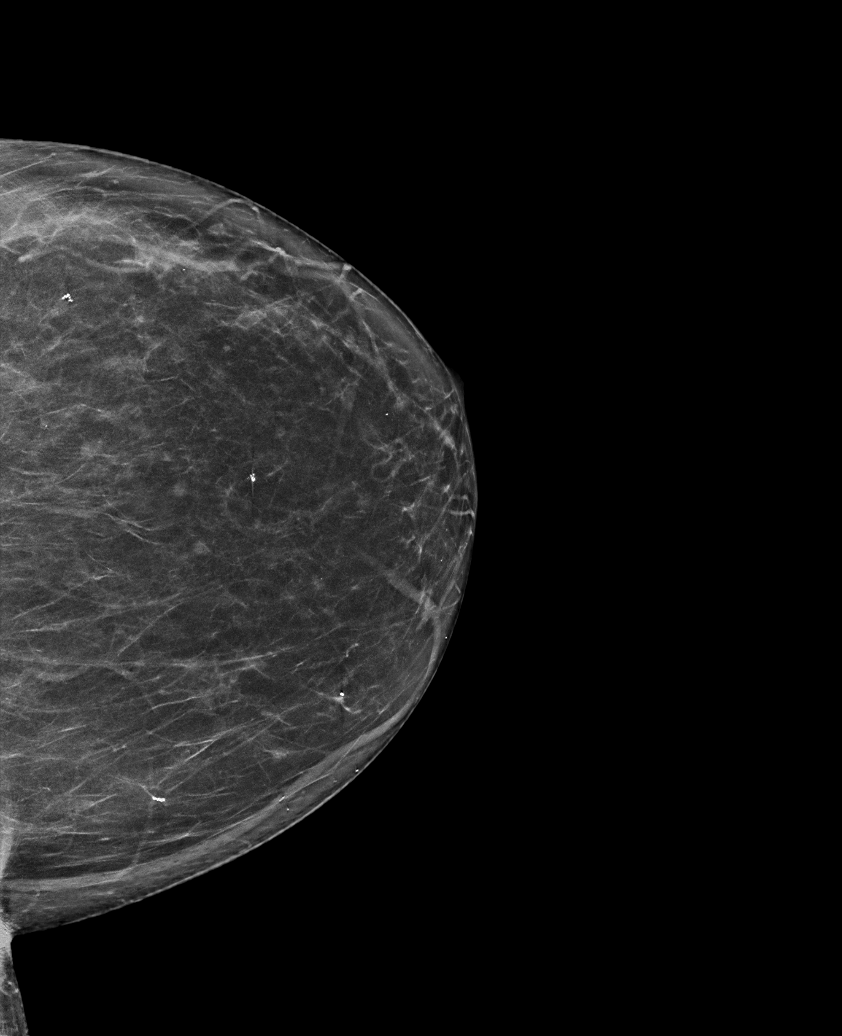

[R MLO tomo · tomo slice 42/83.0]
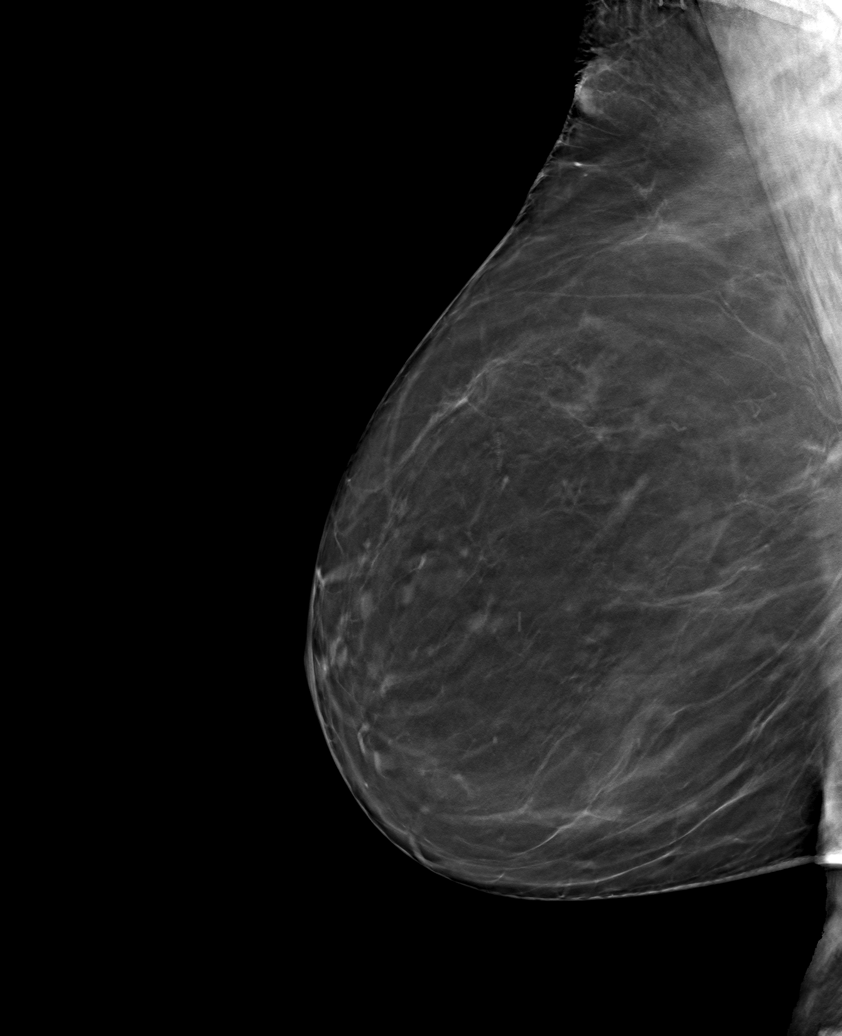

[6 of 30 positions shown; findings below may reference images not displayed]

ACR Breast Density Category b: There are scattered areas of
fibroglandular density.
FINDINGS: There are no findings suspicious for malignancy.
IMPRESSION: No mammographic evidence of malignancy. A result letter of this
screening mammogram will be mailed directly to the patient.

RECOMMENDATION:
Screening mammogram in one year. (Code:51-O-LD2)

BI-RADS CATEGORY  1: Negative.

## 2022-03-19 DIAGNOSIS — E785 Hyperlipidemia, unspecified: Secondary | ICD-10-CM | POA: Diagnosis not present

## 2022-03-19 DIAGNOSIS — M109 Gout, unspecified: Secondary | ICD-10-CM | POA: Diagnosis not present

## 2022-03-19 DIAGNOSIS — R946 Abnormal results of thyroid function studies: Secondary | ICD-10-CM | POA: Diagnosis not present

## 2022-03-25 DIAGNOSIS — M1990 Unspecified osteoarthritis, unspecified site: Secondary | ICD-10-CM | POA: Diagnosis not present

## 2022-03-25 DIAGNOSIS — D692 Other nonthrombocytopenic purpura: Secondary | ICD-10-CM | POA: Diagnosis not present

## 2022-03-25 DIAGNOSIS — R600 Localized edema: Secondary | ICD-10-CM | POA: Diagnosis not present

## 2022-03-25 DIAGNOSIS — M8580 Other specified disorders of bone density and structure, unspecified site: Secondary | ICD-10-CM | POA: Diagnosis not present

## 2022-03-25 DIAGNOSIS — D72819 Decreased white blood cell count, unspecified: Secondary | ICD-10-CM | POA: Diagnosis not present

## 2022-03-25 DIAGNOSIS — E669 Obesity, unspecified: Secondary | ICD-10-CM | POA: Diagnosis not present

## 2022-03-25 DIAGNOSIS — Z1331 Encounter for screening for depression: Secondary | ICD-10-CM | POA: Diagnosis not present

## 2022-03-25 DIAGNOSIS — R609 Edema, unspecified: Secondary | ICD-10-CM | POA: Diagnosis not present

## 2022-03-25 DIAGNOSIS — E785 Hyperlipidemia, unspecified: Secondary | ICD-10-CM | POA: Diagnosis not present

## 2022-03-25 DIAGNOSIS — R82998 Other abnormal findings in urine: Secondary | ICD-10-CM | POA: Diagnosis not present

## 2022-03-25 DIAGNOSIS — N1831 Chronic kidney disease, stage 3a: Secondary | ICD-10-CM | POA: Diagnosis not present

## 2022-03-25 DIAGNOSIS — Z0000 Encounter for general adult medical examination without abnormal findings: Secondary | ICD-10-CM | POA: Diagnosis not present

## 2022-03-25 DIAGNOSIS — I1310 Hypertensive heart and chronic kidney disease without heart failure, with stage 1 through stage 4 chronic kidney disease, or unspecified chronic kidney disease: Secondary | ICD-10-CM | POA: Diagnosis not present

## 2022-03-25 DIAGNOSIS — Z1389 Encounter for screening for other disorder: Secondary | ICD-10-CM | POA: Diagnosis not present

## 2022-03-25 DIAGNOSIS — R2681 Unsteadiness on feet: Secondary | ICD-10-CM | POA: Diagnosis not present

## 2022-04-21 DIAGNOSIS — H52203 Unspecified astigmatism, bilateral: Secondary | ICD-10-CM | POA: Diagnosis not present

## 2022-04-21 DIAGNOSIS — Z961 Presence of intraocular lens: Secondary | ICD-10-CM | POA: Diagnosis not present

## 2022-05-27 ENCOUNTER — Other Ambulatory Visit (HOSPITAL_BASED_OUTPATIENT_CLINIC_OR_DEPARTMENT_OTHER): Payer: Self-pay

## 2022-07-21 ENCOUNTER — Other Ambulatory Visit: Payer: Self-pay | Admitting: Internal Medicine

## 2022-07-21 DIAGNOSIS — Z1231 Encounter for screening mammogram for malignant neoplasm of breast: Secondary | ICD-10-CM

## 2022-07-27 ENCOUNTER — Other Ambulatory Visit: Payer: Self-pay | Admitting: *Deleted

## 2022-07-27 NOTE — Patient Outreach (Signed)
  Care Coordination   Initial Visit Note   07/27/2022 Name: Aerith Canal MRN: [id number] DOB: 03/21/1932  Ward Givens Plude is a 88 y.o. year old female who sees Shon Baton, MD for primary care. I spoke with  Mearl Latin by phone today and introduced the [facility] program. Patient stated that she resides at [facility], she has an excellent health care team currently, and does not need any further health care assistance.  Follow up plan: No further intervention required.  Encounter Outcome:  Pt. Visit Completed

## 2022-08-27 ENCOUNTER — Ambulatory Visit
Admission: RE | Admit: 2022-08-27 | Discharge: 2022-08-27 | Disposition: A | Discharge status: Home / Self Care | Payer: PPO | Source: Ambulatory Visit | Attending: Internal Medicine | Admitting: Internal Medicine

## 2022-08-27 DIAGNOSIS — Z1231 Encounter for screening mammogram for malignant neoplasm of breast: Secondary | ICD-10-CM

## 2022-09-21 ENCOUNTER — Other Ambulatory Visit: Payer: Self-pay

## 2022-09-21 MED ORDER — MIRABEGRON ER 50 MG PO TB24: 50.0000 mg | ORAL_TABLET | Freq: Every day | ORAL | 0 refills | Status: DC

## 2022-09-21 NOTE — Telephone Encounter (Signed)
Medication refill request: myrbetriq '50mg'$  Last AEX:  09-23-21  Next AEX: 09-29-22 Last MMG (if hormonal medication request): 08-27-22 Refill authorized: please approve if appropriate

## 2022-09-23 NOTE — Progress Notes (Deleted)
88 y.o. G3P3 Married Caucasian female here for breast & pelvic exam.    PCP:     Patient's last menstrual period was 12/27/2000.           Sexually active: {yes no:314532}  The current method of family planning is post menopausal status.    Exercising: {yes no:314532}  {types:19826} Smoker:  {YES [id number]  Health Maintenance: Pap:  09-22-20 neg, 09-15-18 neg History of abnormal Pap:  {YES NO:22349} MMG:  08-27-22 category a density birads 1:neg Colonoscopy:  06-13-14 BMD:   2019  Result  osteopenia TDaP:  pcp Gardasil:   no HIV: unsure Hep C: unsure Screening Labs:  Hb today: ***, Urine today: ***   reports that she has never smoked. She has never used smokeless tobacco. She reports that she does not currently use alcohol. She reports that she does not use drugs.  Past Medical History:  Diagnosis Date   Arthritis    osteoarthritis   Complication of anesthesia    SLOW TO AWAKE AFTER 2008 SURGERY, PREFERS SPINAL IF POSSIBLE   Diverticulosis 2001   Elevated cholesterol    Hepatitis 1979   HEPATITIS A WITH JAUNDICE- NO ISSUES NOW   Hypertension    Varicose veins     Past Surgical History:  Procedure Laterality Date   cataract surgery  2016 and 2017   bilateral surgery   COLONOSCOPY WITH PROPOFOL N/A 06/13/2014   Procedure: COLONOSCOPY WITH PROPOFOL;  Surgeon: Cleotis Nipper, MD;  Location: [facility];  Service: Endoscopy;  Laterality: N/A;   COMPLEX HYPERPLASIC  2002   w/o atypia in an  endocx polyp   DILATION AND CURETTAGE OF UTERUS  YRS AGO   X 2   HYSTEROSCOPY  12/2000   D&C   JOINT REPLACEMENT  12/2011   left knee   JOINT REPLACEMENT  2008   right knee   KNEE CLOSED REDUCTION  05/15/2012   Procedure: CLOSED MANIPULATION KNEE;  Surgeon: Gearlean Alf, MD;  Location: [facility];  Service: Orthopedics;  Laterality: Left;   RIGHT ROTATOR CUFF REPAIR  2005   RIGHT TOTAL KNEE REPLACMENT  2008   SQUAMOUS CELL CARCINOMA EXCISION  06/2014   -left leg   TONSILLECTOMY      at 18 yrs. old   TOTAL KNEE ARTHROPLASTY  01/19/2012   Procedure: TOTAL KNEE ARTHROPLASTY;  Surgeon: Gearlean Alf, MD;  Location: [facility];  Service: Orthopedics;  Laterality: Left;    Current Outpatient Medications  Medication Sig Dispense Refill   acetaminophen (TYLENOL) 500 MG tablet Take 1,000 mg by mouth every 6 (six) hours as needed for mild pain, moderate pain, fever or headache.     BIOTIN PO Take 1 tablet by mouth daily.     Calcium Citrate-Vitamin D (CITRACAL + D PO) Take 2 tablets by mouth 2 (two) times daily.      fluorometholone (FML) 0.1 % ophthalmic suspension Place 1 drop into both eyes daily as needed for dry eyes.     furosemide (LASIX) 20 MG tablet Take 20 mg by mouth daily as needed.     glucosamine-chondroitin 500-400 MG tablet Take 1 tablet by mouth 2 (two) times daily.      indapamide (LOZOL) 2.5 MG tablet Take 2.5 mg by mouth daily with breakfast.      losartan (COZAAR) 25 MG tablet      mirabegron ER (MYRBETRIQ) 50 MG TB24 tablet Take 1 tablet (50 mg total) by mouth at bedtime. 30 tablet 0  Multiple Vitamin (MULITIVITAMIN WITH MINERALS) TABS Take 1 tablet by mouth daily with breakfast.      rosuvastatin (CRESTOR) 10 MG tablet Take 10 mg by mouth every Monday, Wednesday, and Friday.      traZODone (DESYREL) 100 MG tablet Take 1 tablet by mouth at bedtime.     venlafaxine XR (EFFEXOR-XR) 37.5 MG 24 hr capsule Take 1 capsule by mouth daily.     No current facility-administered medications for this visit.    Family History  Problem Relation Age of Onset   CVA Mother    Heart failure Mother    Heart failure Father    Breast cancer Sister     Review of Systems  Exam:   LMP 12/27/2000     General appearance: alert, cooperative and appears stated age Head: normocephalic, without obvious abnormality, atraumatic Neck: no adenopathy, supple, symmetrical, trachea midline and thyroid normal to inspection and palpation Lungs: clear to auscultation  bilaterally Breasts: normal appearance, no masses or tenderness, No nipple retraction or dimpling, No nipple discharge or bleeding, No axillary adenopathy Heart: regular rate and rhythm Abdomen: soft, non-tender; no masses, no organomegaly Extremities: extremities normal, atraumatic, no cyanosis or edema Skin: skin color, texture, turgor normal. No rashes or lesions Lymph nodes: cervical, supraclavicular, and axillary nodes normal. Neurologic: grossly normal  Pelvic: External genitalia:  no lesions              No abnormal inguinal nodes palpated.              Urethra:  normal appearing urethra with no masses, tenderness or lesions              Bartholins and Skenes: normal                 Vagina: normal appearing vagina with normal color and discharge, no lesions              Cervix: no lesions              Pap taken: {yes no:314532} Bimanual Exam:  Uterus:  normal size, contour, position, consistency, mobility, non-tender              Adnexa: no mass, fullness, tenderness              Rectal exam: {yes no:314532}.  Confirms.              Anus:  normal sphincter tone, no lesions  Chaperone was present for exam:  ***  Assessment:   Well woman visit with gynecologic exam.   Plan: Mammogram screening discussed. Self breast awareness reviewed. Pap and HR HPV as above. Guidelines for Calcium, Vitamin D, regular exercise program including cardiovascular and weight bearing exercise.   Follow up annually and prn.   Additional counseling given.  {yes [id number]. _______ minutes face to face time of which over 50% was spent in counseling.    After visit summary provided.

## 2022-09-29 ENCOUNTER — Ambulatory Visit: Payer: PPO | Admitting: Obstetrics and Gynecology

## 2022-10-04 ENCOUNTER — Encounter: Payer: Self-pay | Admitting: Obstetrics and Gynecology

## 2022-10-04 ENCOUNTER — Ambulatory Visit (INDEPENDENT_AMBULATORY_CARE_PROVIDER_SITE_OTHER): Payer: PPO | Admitting: Obstetrics and Gynecology

## 2022-10-04 VITALS — BP 122/70 | HR 66 | Ht 64.0 in | Wt 192.0 lb

## 2022-10-04 DIAGNOSIS — K5900 Constipation, unspecified: Secondary | ICD-10-CM | POA: Diagnosis not present

## 2022-10-04 DIAGNOSIS — Z634 Disappearance and death of family member: Secondary | ICD-10-CM | POA: Diagnosis not present

## 2022-10-04 DIAGNOSIS — N3281 Overactive bladder: Secondary | ICD-10-CM

## 2022-10-04 MED ORDER — MIRABEGRON ER 50 MG PO TB24: 50.0000 mg | ORAL_TABLET | Freq: Every day | ORAL | 3 refills | Status: DC

## 2022-10-04 NOTE — Patient Instructions (Signed)
Constipation, Adult  Consider Colace, which is an oral stool softener, which can be taken once a day.     You may also consider Miralax, which is a laxative powder, which can also be taken once a day.  Constipation is when a person has fewer than three bowel movements in a week, has difficulty having a bowel movement, or has stools (feces) that are dry, hard, or larger than normal. Constipation may be caused by an underlying condition. It may become worse with age if a person takes certain medicines and does not take in enough fluids. Follow these instructions at home: Eating and drinking  Eat foods that have a lot of fiber, such as beans, whole grains, and fresh fruits and vegetables. Limit foods that are low in fiber and high in fat and processed sugars, such as fried or sweet foods. These include french fries, hamburgers, cookies, candies, and soda. Drink enough fluid to keep your urine pale yellow. General instructions Exercise regularly or as told by your health care provider. Try to do 150 minutes of moderate exercise each week. Use the bathroom when you have the urge to go. Do not hold it in. Take over-the-counter and prescription medicines only as told by your health care provider. This includes any fiber supplements. During bowel movements: Practice deep breathing while relaxing the lower abdomen. Practice pelvic floor relaxation. Watch your condition for any changes. Let your health care provider know about them. Keep all follow-up visits as told by your health care provider. This is important. Contact a health care provider if: You have pain that gets worse. You have a fever. You do not have a bowel movement after 4 days. You vomit. You are not hungry or you lose weight. You are bleeding from the opening between the buttocks (anus). You have thin, pencil-like stools. Get help right away if: You have a fever and your symptoms suddenly get worse. You leak stool or have blood  in your stool. Your abdomen is bloated. You have severe pain in your abdomen. You feel dizzy or you faint. Summary Constipation is when a person has fewer than three bowel movements in a week, has difficulty having a bowel movement, or has stools (feces) that are dry, hard, or larger than normal. Eat foods that have a lot of fiber, such as beans, whole grains, and fresh fruits and vegetables. Drink enough fluid to keep your urine pale yellow. Take over-the-counter and prescription medicines only as told by your health care provider. This includes any fiber supplements. This information is not intended to replace advice given to you by your health care provider. Make sure you discuss any questions you have with your health care provider. Document Revised: 10/31/2019 Document Reviewed: 10/31/2019 Elsevier Patient Education  [place].

## 2022-10-04 NOTE — Progress Notes (Signed)
88 y.o. G3P3 Widowed Caucasian female here for office visit.  Her husband passed away a couple of weeks ago.  She has good support  Patient is followed for overactive bladder.  She is taking Myrbetriq, and she wants to continue this. If she drinks a lot of water, the Myrbetriq does not work as well.  Wears a pad.   Has some vulvar itching.  No discharge.  Some constipation.   Using a walker today.  PCP:   Shon Baton  Patient's last menstrual period was 12/27/2000.           Sexually active: No.  The current method of family planning is post menopausal status.    Exercising: Yes.     Walking  Smoker:  no  Health Maintenance: Pap:   09-22-20 Neg, 09-15-18 Neg, 09-11-16 Neg History of abnormal Pap:  no MMG:  08/27/22 Bi-/rads 1 neg  Colonoscopy:  06/13/14 normal  BMD:  2019  Result :Osteopenia per patient--Dr.Russo TDaP:  pcp  Gardasil:   no HIV: unsure  Hep C: unsure  Screening Labs:  PCP   reports that she has never smoked. She has never used smokeless tobacco. She reports that she does not currently use alcohol. She reports that she does not use drugs.  Past Medical History:  Diagnosis Date   Arthritis    osteoarthritis   Complication of anesthesia    SLOW TO AWAKE AFTER 2008 SURGERY, PREFERS SPINAL IF POSSIBLE   Diverticulosis 2001   Elevated cholesterol    Hepatitis 1979   HEPATITIS A WITH JAUNDICE- NO ISSUES NOW   Hypertension    Varicose veins     Past Surgical History:  Procedure Laterality Date   cataract surgery  2016 and 2017   bilateral surgery   COLONOSCOPY WITH PROPOFOL N/A 06/13/2014   Procedure: COLONOSCOPY WITH PROPOFOL;  Surgeon: Cleotis Nipper, MD;  Location: [facility];  Service: Endoscopy;  Laterality: N/A;   COMPLEX HYPERPLASIC  2002   w/o atypia in an  endocx polyp   DILATION AND CURETTAGE OF UTERUS  YRS AGO   X 2   HYSTEROSCOPY  12/2000   D&C   JOINT REPLACEMENT  12/2011   left knee   JOINT REPLACEMENT  2008   right knee   KNEE  CLOSED REDUCTION  05/15/2012   Procedure: CLOSED MANIPULATION KNEE;  Surgeon: Gearlean Alf, MD;  Location: [facility];  Service: Orthopedics;  Laterality: Left;   RIGHT ROTATOR CUFF REPAIR  2005   RIGHT TOTAL KNEE REPLACMENT  2008   SQUAMOUS CELL CARCINOMA EXCISION  06/2014   -left leg   TONSILLECTOMY     at 18 yrs. old   TOTAL KNEE ARTHROPLASTY  01/19/2012   Procedure: TOTAL KNEE ARTHROPLASTY;  Surgeon: Gearlean Alf, MD;  Location: [facility];  Service: Orthopedics;  Laterality: Left;    Current Outpatient Medications  Medication Sig Dispense Refill   acetaminophen (TYLENOL) 500 MG tablet Take 1,000 mg by mouth every 6 (six) hours as needed for mild pain, moderate pain, fever or headache.     BIOTIN PO Take 1 tablet by mouth daily.     Calcium Citrate-Vitamin D (CITRACAL + D PO) Take 2 tablets by mouth 2 (two) times daily.      fluorometholone (FML) 0.1 % ophthalmic suspension Place 1 drop into both eyes daily as needed for dry eyes.     furosemide (LASIX) 20 MG tablet Take 20 mg by mouth daily as needed.  glucosamine-chondroitin 500-400 MG tablet Take 1 tablet by mouth 2 (two) times daily.      indapamide (LOZOL) 2.5 MG tablet Take 2.5 mg by mouth daily with breakfast.      losartan (COZAAR) 25 MG tablet      Multiple Vitamin (MULITIVITAMIN WITH MINERALS) TABS Take 1 tablet by mouth daily with breakfast.      rosuvastatin (CRESTOR) 10 MG tablet Take 10 mg by mouth every Monday, Wednesday, and Friday.      traZODone (DESYREL) 100 MG tablet Take 1 tablet by mouth at bedtime.     venlafaxine XR (EFFEXOR-XR) 37.5 MG 24 hr capsule Take 1 capsule by mouth daily.     mirabegron ER (MYRBETRIQ) 50 MG TB24 tablet Take 1 tablet (50 mg total) by mouth at bedtime. 90 tablet 3   No current facility-administered medications for this visit.    Family History  Problem Relation Age of Onset   CVA Mother    Heart failure Mother    Heart failure Father    Breast cancer Sister     Review of Systems   All other systems reviewed and are negative.   Exam:   BP 122/70   Pulse 66   Ht '5\' 4"'$  (1.626 m)   Wt 192 lb (87.1 kg)   LMP 12/27/2000   SpO2 100%   BMI 32.96 kg/m     General appearance: alert, cooperative and appears stated age Head: normocephalic, without obvious abnormality, atraumatic Lungs: clear to auscultation bilaterally Heart: regular rate and rhythm Abdomen: soft, non-tender; no masses, no organomegaly  Pelvic: External genitalia:  no lesions              No abnormal inguinal nodes palpated.              Urethra:  normal appearing urethra with no masses, tenderness or lesions              Bartholins and Skenes: normal                 Vagina: normal appearing vagina with normal color and discharge, no lesions              Cervix: no lesions              Pap taken: no Bimanual Exam:  Uterus:  normal size, contour, position, consistency, mobility, non-tender              Adnexa: no mass, fullness, tenderness              Rectal exam: yes.  Confirms.              Anus:  normal sphincter tone, no lesions  Chaperone was present for exam:  yes.   Assessment:    Overactive bladder.  Controlled with Myrbetriq.  Constipation.  Vulvar irritation.  Bereavement.   Plan:   Continue Myrbetriq 50 mg daily.  She declines pelvic floor therapy.  We talked about treating constipation with increased water/fruits/vegetables in diet and try Colace 100 mg daily or Miralax.   Use Aquafor or Vaseline for the vulva.  Support given for the loss of her husband.  Follow up in one year for breast and pelvic exam, pap and follow up.  After visit summary provided.   31 min  total time was spent for this patient encounter, including preparation, face-to-face counseling with the patient, coordination of care, and documentation of the encounter.

## 2022-10-07 DIAGNOSIS — Z23 Encounter for immunization: Secondary | ICD-10-CM | POA: Diagnosis not present

## 2022-10-07 DIAGNOSIS — N1831 Chronic kidney disease, stage 3a: Secondary | ICD-10-CM | POA: Diagnosis not present

## 2022-10-07 DIAGNOSIS — F325 Major depressive disorder, single episode, in full remission: Secondary | ICD-10-CM | POA: Diagnosis not present

## 2022-10-07 DIAGNOSIS — E785 Hyperlipidemia, unspecified: Secondary | ICD-10-CM | POA: Diagnosis not present

## 2022-10-07 DIAGNOSIS — I872 Venous insufficiency (chronic) (peripheral): Secondary | ICD-10-CM | POA: Diagnosis not present

## 2022-10-07 DIAGNOSIS — I1310 Hypertensive heart and chronic kidney disease without heart failure, with stage 1 through stage 4 chronic kidney disease, or unspecified chronic kidney disease: Secondary | ICD-10-CM | POA: Diagnosis not present

## 2022-10-07 DIAGNOSIS — E669 Obesity, unspecified: Secondary | ICD-10-CM | POA: Diagnosis not present

## 2022-10-07 DIAGNOSIS — D696 Thrombocytopenia, unspecified: Secondary | ICD-10-CM | POA: Diagnosis not present

## 2022-10-07 DIAGNOSIS — M8580 Other specified disorders of bone density and structure, unspecified site: Secondary | ICD-10-CM | POA: Diagnosis not present

## 2022-10-07 DIAGNOSIS — R600 Localized edema: Secondary | ICD-10-CM | POA: Diagnosis not present

## 2022-10-07 DIAGNOSIS — D72819 Decreased white blood cell count, unspecified: Secondary | ICD-10-CM | POA: Diagnosis not present

## 2022-10-07 DIAGNOSIS — C4492 Squamous cell carcinoma of skin, unspecified: Secondary | ICD-10-CM | POA: Diagnosis not present

## 2022-10-07 DIAGNOSIS — D692 Other nonthrombocytopenic purpura: Secondary | ICD-10-CM | POA: Diagnosis not present

## 2023-02-16 DIAGNOSIS — L720 Epidermal cyst: Secondary | ICD-10-CM | POA: Diagnosis not present

## 2023-02-16 DIAGNOSIS — L298 Other pruritus: Secondary | ICD-10-CM | POA: Diagnosis not present

## 2023-02-16 DIAGNOSIS — L821 Other seborrheic keratosis: Secondary | ICD-10-CM | POA: Diagnosis not present

## 2023-02-16 DIAGNOSIS — Z85828 Personal history of other malignant neoplasm of skin: Secondary | ICD-10-CM | POA: Diagnosis not present

## 2023-03-24 DIAGNOSIS — D72819 Decreased white blood cell count, unspecified: Secondary | ICD-10-CM | POA: Diagnosis not present

## 2023-03-24 DIAGNOSIS — R600 Localized edema: Secondary | ICD-10-CM | POA: Diagnosis not present

## 2023-03-24 DIAGNOSIS — D696 Thrombocytopenia, unspecified: Secondary | ICD-10-CM | POA: Diagnosis not present

## 2023-03-24 DIAGNOSIS — D692 Other nonthrombocytopenic purpura: Secondary | ICD-10-CM | POA: Diagnosis not present

## 2023-03-24 DIAGNOSIS — C4492 Squamous cell carcinoma of skin, unspecified: Secondary | ICD-10-CM | POA: Diagnosis not present

## 2023-03-24 DIAGNOSIS — N1831 Chronic kidney disease, stage 3a: Secondary | ICD-10-CM | POA: Diagnosis not present

## 2023-03-24 DIAGNOSIS — G4700 Insomnia, unspecified: Secondary | ICD-10-CM | POA: Diagnosis not present

## 2023-03-24 DIAGNOSIS — M8580 Other specified disorders of bone density and structure, unspecified site: Secondary | ICD-10-CM | POA: Diagnosis not present

## 2023-03-24 DIAGNOSIS — N3941 Urge incontinence: Secondary | ICD-10-CM | POA: Diagnosis not present

## 2023-03-24 DIAGNOSIS — E785 Hyperlipidemia, unspecified: Secondary | ICD-10-CM | POA: Diagnosis not present

## 2023-03-24 DIAGNOSIS — M1990 Unspecified osteoarthritis, unspecified site: Secondary | ICD-10-CM | POA: Diagnosis not present

## 2023-03-24 DIAGNOSIS — I1310 Hypertensive heart and chronic kidney disease without heart failure, with stage 1 through stage 4 chronic kidney disease, or unspecified chronic kidney disease: Secondary | ICD-10-CM | POA: Diagnosis not present

## 2023-03-29 DIAGNOSIS — L814 Other melanin hyperpigmentation: Secondary | ICD-10-CM | POA: Diagnosis not present

## 2023-03-29 DIAGNOSIS — D1801 Hemangioma of skin and subcutaneous tissue: Secondary | ICD-10-CM | POA: Diagnosis not present

## 2023-03-29 DIAGNOSIS — L738 Other specified follicular disorders: Secondary | ICD-10-CM | POA: Diagnosis not present

## 2023-03-29 DIAGNOSIS — L570 Actinic keratosis: Secondary | ICD-10-CM | POA: Diagnosis not present

## 2023-03-29 DIAGNOSIS — Z85828 Personal history of other malignant neoplasm of skin: Secondary | ICD-10-CM | POA: Diagnosis not present

## 2023-03-29 DIAGNOSIS — L821 Other seborrheic keratosis: Secondary | ICD-10-CM | POA: Diagnosis not present

## 2023-03-29 DIAGNOSIS — L308 Other specified dermatitis: Secondary | ICD-10-CM | POA: Diagnosis not present

## 2023-03-29 DIAGNOSIS — I872 Venous insufficiency (chronic) (peripheral): Secondary | ICD-10-CM | POA: Diagnosis not present

## 2023-04-01 DIAGNOSIS — F418 Other specified anxiety disorders: Secondary | ICD-10-CM | POA: Diagnosis not present

## 2023-04-01 DIAGNOSIS — N1831 Chronic kidney disease, stage 3a: Secondary | ICD-10-CM | POA: Diagnosis not present

## 2023-04-01 DIAGNOSIS — D692 Other nonthrombocytopenic purpura: Secondary | ICD-10-CM | POA: Diagnosis not present

## 2023-04-01 DIAGNOSIS — Z0000 Encounter for general adult medical examination without abnormal findings: Secondary | ICD-10-CM | POA: Diagnosis not present

## 2023-04-01 DIAGNOSIS — R600 Localized edema: Secondary | ICD-10-CM | POA: Diagnosis not present

## 2023-04-01 DIAGNOSIS — M1990 Unspecified osteoarthritis, unspecified site: Secondary | ICD-10-CM | POA: Diagnosis not present

## 2023-04-01 DIAGNOSIS — F325 Major depressive disorder, single episode, in full remission: Secondary | ICD-10-CM | POA: Diagnosis not present

## 2023-04-01 DIAGNOSIS — G4700 Insomnia, unspecified: Secondary | ICD-10-CM | POA: Diagnosis not present

## 2023-04-01 DIAGNOSIS — R82998 Other abnormal findings in urine: Secondary | ICD-10-CM | POA: Diagnosis not present

## 2023-04-01 DIAGNOSIS — E669 Obesity, unspecified: Secondary | ICD-10-CM | POA: Diagnosis not present

## 2023-04-01 DIAGNOSIS — I1310 Hypertensive heart and chronic kidney disease without heart failure, with stage 1 through stage 4 chronic kidney disease, or unspecified chronic kidney disease: Secondary | ICD-10-CM | POA: Diagnosis not present

## 2023-04-01 DIAGNOSIS — E785 Hyperlipidemia, unspecified: Secondary | ICD-10-CM | POA: Diagnosis not present

## 2023-04-01 DIAGNOSIS — M8580 Other specified disorders of bone density and structure, unspecified site: Secondary | ICD-10-CM | POA: Diagnosis not present

## 2023-04-28 DIAGNOSIS — Z961 Presence of intraocular lens: Secondary | ICD-10-CM | POA: Diagnosis not present

## 2023-04-28 DIAGNOSIS — H52203 Unspecified astigmatism, bilateral: Secondary | ICD-10-CM | POA: Diagnosis not present

## 2023-07-20 ENCOUNTER — Other Ambulatory Visit: Payer: Self-pay | Admitting: Internal Medicine

## 2023-07-20 DIAGNOSIS — Z1231 Encounter for screening mammogram for malignant neoplasm of breast: Secondary | ICD-10-CM

## 2023-08-30 ENCOUNTER — Ambulatory Visit: Payer: PPO

## 2023-08-31 ENCOUNTER — Ambulatory Visit: Admission: RE | Admit: 2023-08-31 | Payer: PPO | Source: Ambulatory Visit

## 2023-08-31 DIAGNOSIS — Z1231 Encounter for screening mammogram for malignant neoplasm of breast: Secondary | ICD-10-CM | POA: Diagnosis not present

## 2023-09-02 ENCOUNTER — Other Ambulatory Visit: Payer: Self-pay | Admitting: Internal Medicine

## 2023-09-02 DIAGNOSIS — R928 Other abnormal and inconclusive findings on diagnostic imaging of breast: Secondary | ICD-10-CM

## 2023-09-07 DIAGNOSIS — C44329 Squamous cell carcinoma of skin of other parts of face: Secondary | ICD-10-CM | POA: Diagnosis not present

## 2023-09-07 DIAGNOSIS — L821 Other seborrheic keratosis: Secondary | ICD-10-CM | POA: Diagnosis not present

## 2023-09-07 DIAGNOSIS — Z85828 Personal history of other malignant neoplasm of skin: Secondary | ICD-10-CM | POA: Diagnosis not present

## 2023-09-07 DIAGNOSIS — I872 Venous insufficiency (chronic) (peripheral): Secondary | ICD-10-CM | POA: Diagnosis not present

## 2023-09-07 DIAGNOSIS — D485 Neoplasm of uncertain behavior of skin: Secondary | ICD-10-CM | POA: Diagnosis not present

## 2023-09-07 DIAGNOSIS — L570 Actinic keratosis: Secondary | ICD-10-CM | POA: Diagnosis not present

## 2023-09-07 DIAGNOSIS — H61001 Unspecified perichondritis of right external ear: Secondary | ICD-10-CM | POA: Diagnosis not present

## 2023-09-07 DIAGNOSIS — L308 Other specified dermatitis: Secondary | ICD-10-CM | POA: Diagnosis not present

## 2023-09-08 ENCOUNTER — Ambulatory Visit
Admission: RE | Admit: 2023-09-08 | Discharge: 2023-09-08 | Disposition: A | Discharge status: Home / Self Care | Payer: PPO | Source: Ambulatory Visit | Attending: Internal Medicine | Admitting: Internal Medicine

## 2023-09-08 DIAGNOSIS — R928 Other abnormal and inconclusive findings on diagnostic imaging of breast: Secondary | ICD-10-CM

## 2023-09-09 ENCOUNTER — Other Ambulatory Visit: Payer: Self-pay | Admitting: Internal Medicine

## 2023-09-09 DIAGNOSIS — N641 Fat necrosis of breast: Secondary | ICD-10-CM

## 2023-09-13 DIAGNOSIS — H1131 Conjunctival hemorrhage, right eye: Secondary | ICD-10-CM | POA: Diagnosis not present

## 2023-09-26 DIAGNOSIS — Z85828 Personal history of other malignant neoplasm of skin: Secondary | ICD-10-CM | POA: Diagnosis not present

## 2023-09-26 DIAGNOSIS — C44329 Squamous cell carcinoma of skin of other parts of face: Secondary | ICD-10-CM | POA: Diagnosis not present

## 2023-09-29 NOTE — Progress Notes (Signed)
89 y.o. G3P3 Widowed Caucasian female here for her breast and pelvic exam and follow up of her overactive bladder.   She is inquiring if she needs to continue care at this office.   She takes Myrbetriq 50 mg daily.   Having urinary incontinence.   Can leak with cough or sneeze. Wearing a pad.  Notices pad is wet, can be light or heavier incontinence.  Up once a night to void.  Voiding every few hours during the day.   No dysuria, no hematuria.  Voiding well.   PCP:   Dr. Timothy Lasso  Patient's last menstrual period was 12/27/2000.           Sexually active: No.  The current method of family planning is post menopausal status.    Exercising: No.   Smoker:  no  Health Maintenance: Pap:  09/22/20 neg, 09/15/18 neg History of abnormal Pap:  no MMG:  09/08/23 Breast Density Cat B, BI-RADS CAT 3 probably benign.  Due for follow up in December, 2024.  Colonoscopy:  06/13/14 BMD:   n/a  Result  n/a TDaP:  06/21/21 Gardasil:   no HIV:d unsure Hep C: unsure Screening Labs:  PCP   reports that she has never smoked. She has never used smokeless tobacco. She reports that she does not currently use alcohol. She reports that she does not use drugs.  Past Medical History:  Diagnosis Date   Arthritis    osteoarthritis   Complication of anesthesia    SLOW TO AWAKE AFTER 2008 SURGERY, PREFERS SPINAL IF POSSIBLE   Diverticulosis 2001   Elevated cholesterol    Hepatitis 1979   HEPATITIS A WITH JAUNDICE- NO ISSUES NOW   Hypertension    Varicose veins     Past Surgical History:  Procedure Laterality Date   cataract surgery  2016 and 2017   bilateral surgery   COLONOSCOPY WITH PROPOFOL N/A 06/13/2014   Procedure: COLONOSCOPY WITH PROPOFOL;  Surgeon: Florencia Reasons, MD;  Location: [facility];  Service: Endoscopy;  Laterality: N/A;   COMPLEX HYPERPLASIC  2002   w/o atypia in an  endocx polyp   DILATION AND CURETTAGE OF UTERUS  YRS AGO   X 2   HYSTEROSCOPY  12/2000   D&C   JOINT  REPLACEMENT  12/2011   left knee   JOINT REPLACEMENT  2008   right knee   KNEE CLOSED REDUCTION  05/15/2012   Procedure: CLOSED MANIPULATION KNEE;  Surgeon: Loanne Drilling, MD;  Location: [facility];  Service: Orthopedics;  Laterality: Left;   RIGHT ROTATOR CUFF REPAIR  2005   RIGHT TOTAL KNEE REPLACMENT  2008   SQUAMOUS CELL CARCINOMA EXCISION  06/2014   -left leg   TONSILLECTOMY     at 18 yrs. old   TOTAL KNEE ARTHROPLASTY  01/19/2012   Procedure: TOTAL KNEE ARTHROPLASTY;  Surgeon: Loanne Drilling, MD;  Location: [facility];  Service: Orthopedics;  Laterality: Left;    Current Outpatient Medications  Medication Sig Dispense Refill   BIOTIN PO Take 1 tablet by mouth daily.     Calcium Citrate-Vitamin D (CITRACAL + D PO) Take 2 tablets by mouth 2 (two) times daily.      fluorometholone (FML) 0.1 % ophthalmic suspension Place 1 drop into both eyes daily as needed for dry eyes.     furosemide (LASIX) 20 MG tablet Take 20 mg by mouth daily as needed.     glucosamine-chondroitin 500-400 MG tablet Take 1 tablet by mouth 2 (two)  times daily.      indapamide (LOZOL) 2.5 MG tablet Take 2.5 mg by mouth daily with breakfast.      losartan (COZAAR) 25 MG tablet      mirabegron ER (MYRBETRIQ) 50 MG TB24 tablet Take 1 tablet (50 mg total) by mouth at bedtime. 90 tablet 3   Multiple Vitamin (MULITIVITAMIN WITH MINERALS) TABS Take 1 tablet by mouth daily with breakfast.      rosuvastatin (CRESTOR) 10 MG tablet Take 10 mg by mouth every Monday, Wednesday, and Friday.      traZODone (DESYREL) 100 MG tablet Take 1 tablet by mouth at bedtime.     venlafaxine XR (EFFEXOR-XR) 37.5 MG 24 hr capsule Take 1 capsule by mouth daily.     No current facility-administered medications for this visit.    Family History  Problem Relation Age of Onset   CVA Mother    Heart failure Mother    Heart failure Father    Breast cancer Sister     Review of Systems  All other systems reviewed and are negative.   Exam:    BP 128/80 (BP Location: Right Arm, Patient Position: Sitting, Cuff Size: Normal)   Ht 5\' 2"  (1.575 m)   Wt 190 lb (86.2 kg)   LMP 12/27/2000   BMI 34.75 kg/m     General appearance: alert, cooperative and appears stated age Head: normocephalic, without obvious abnormality, atraumatic Neck: no adenopathy, supple, symmetrical, trachea midline and thyroid normal to inspection and palpation Lungs: clear to auscultation bilaterally Breasts: normal appearance, no masses or tenderness, No nipple retraction or dimpling, No nipple discharge or bleeding, No axillary adenopathy Heart: regular rate and rhythm Abdomen: soft, non-tender; no masses, no organomegaly Extremities: extremities normal, atraumatic, no cyanosis or edema Skin: skin color, texture, turgor normal. No rashes or lesions Lymph nodes: cervical, supraclavicular, and axillary nodes normal. Neurologic: grossly normal  Pelvic: External genitalia:  no lesions              No abnormal inguinal nodes palpated.              Urethra:  normal appearing urethra with no masses, tenderness or lesions              Bartholins and Skenes: normal                 Vagina: normal appearing vagina with normal color and discharge, no lesions              Cervix: no lesions              Pap taken: no Bimanual Exam:  Uterus:  normal size, contour, position, consistency, mobility, non-tender              Adnexa: no mass, fullness, tenderness              Rectal exam: yes.  Confirms.              Anus:  normal sphincter tone, no lesions  Chaperone was present for exam:  Warren Lacy, CMA  Assessment:   Encounter for breast and pelvic exam.  Mixed incontinence.  Encounter for medication monitoring.   Plan: Mammogram screening discussed. Self breast awareness reviewed. Pap and HR HPV not needed.  She may discontinue cervical cancer screening.  Guidelines for Calcium, Vitamin D, regular exercise program including cardiovascular and weight bearing  exercise. She will stop Myrbetriq.  Start Gemtesa 75 mg daily.  Potential side effects reviewed.  She will let me know how she is doing on Gemtesa 75 mg daily. Patient will decide if she will continue to be seen at this office or if she will try to incorporate her care and her overactive bladder medication at her PCP office.  Follow up prn.   25 min  total time was spent for this patient encounter, including preparation, face-to-face counseling with the patient, coordination of care, and documentation of the encounter in addition to doing the breast and pelvic exam.

## 2023-10-13 ENCOUNTER — Encounter: Payer: Self-pay | Admitting: Obstetrics and Gynecology

## 2023-10-13 ENCOUNTER — Ambulatory Visit (INDEPENDENT_AMBULATORY_CARE_PROVIDER_SITE_OTHER): Payer: PPO | Admitting: Obstetrics and Gynecology

## 2023-10-13 VITALS — BP 128/80 | Ht 62.0 in | Wt 190.0 lb

## 2023-10-13 DIAGNOSIS — N3946 Mixed incontinence: Secondary | ICD-10-CM | POA: Diagnosis not present

## 2023-10-13 DIAGNOSIS — Z79899 Other long term (current) drug therapy: Secondary | ICD-10-CM | POA: Diagnosis not present

## 2023-10-13 DIAGNOSIS — N3281 Overactive bladder: Secondary | ICD-10-CM

## 2023-10-13 DIAGNOSIS — Z01419 Encounter for gynecological examination (general) (routine) without abnormal findings: Secondary | ICD-10-CM

## 2023-10-13 MED ORDER — GEMTESA 75 MG PO TABS: 75.0000 mg | ORAL_TABLET | Freq: Every day | ORAL | 1 refills | Status: AC

## 2023-10-13 NOTE — Patient Instructions (Signed)

## 2023-10-24 ENCOUNTER — Other Ambulatory Visit: Payer: Self-pay | Admitting: Obstetrics and Gynecology

## 2023-10-24 NOTE — Telephone Encounter (Signed)
Medication refill request: myrbetriq 50mg  Last AEX:  10-13-23 Next AEX: not scheduled Last MMG (if hormonal medication request): n/a Refill authorized: pt to stop myrbetriq & start Gemtesa. This rx denied

## 2023-10-27 DIAGNOSIS — M8580 Other specified disorders of bone density and structure, unspecified site: Secondary | ICD-10-CM | POA: Diagnosis not present

## 2023-10-27 DIAGNOSIS — E669 Obesity, unspecified: Secondary | ICD-10-CM | POA: Diagnosis not present

## 2023-10-27 DIAGNOSIS — N3941 Urge incontinence: Secondary | ICD-10-CM | POA: Diagnosis not present

## 2023-10-27 DIAGNOSIS — E785 Hyperlipidemia, unspecified: Secondary | ICD-10-CM | POA: Diagnosis not present

## 2023-10-27 DIAGNOSIS — R600 Localized edema: Secondary | ICD-10-CM | POA: Diagnosis not present

## 2023-10-27 DIAGNOSIS — I872 Venous insufficiency (chronic) (peripheral): Secondary | ICD-10-CM | POA: Diagnosis not present

## 2023-10-27 DIAGNOSIS — C4492 Squamous cell carcinoma of skin, unspecified: Secondary | ICD-10-CM | POA: Diagnosis not present

## 2023-10-27 DIAGNOSIS — I1310 Hypertensive heart and chronic kidney disease without heart failure, with stage 1 through stage 4 chronic kidney disease, or unspecified chronic kidney disease: Secondary | ICD-10-CM | POA: Diagnosis not present

## 2023-10-27 DIAGNOSIS — F418 Other specified anxiety disorders: Secondary | ICD-10-CM | POA: Diagnosis not present

## 2023-10-27 DIAGNOSIS — F325 Major depressive disorder, single episode, in full remission: Secondary | ICD-10-CM | POA: Diagnosis not present

## 2023-10-27 DIAGNOSIS — N1831 Chronic kidney disease, stage 3a: Secondary | ICD-10-CM | POA: Diagnosis not present

## 2023-10-27 DIAGNOSIS — D692 Other nonthrombocytopenic purpura: Secondary | ICD-10-CM | POA: Diagnosis not present

## 2023-12-09 ENCOUNTER — Ambulatory Visit
Admission: RE | Admit: 2023-12-09 | Discharge: 2023-12-09 | Disposition: A | Discharge status: Home / Self Care | Payer: PPO | Source: Ambulatory Visit | Attending: Internal Medicine | Admitting: Internal Medicine

## 2023-12-09 DIAGNOSIS — N641 Fat necrosis of breast: Secondary | ICD-10-CM

## 2024-01-24 DIAGNOSIS — H0014 Chalazion left upper eyelid: Secondary | ICD-10-CM | POA: Diagnosis not present

## 2024-01-31 DIAGNOSIS — H0014 Chalazion left upper eyelid: Secondary | ICD-10-CM | POA: Diagnosis not present

## 2024-03-23 ENCOUNTER — Other Ambulatory Visit (HOSPITAL_BASED_OUTPATIENT_CLINIC_OR_DEPARTMENT_OTHER): Payer: Self-pay

## 2024-03-23 DIAGNOSIS — E785 Hyperlipidemia, unspecified: Secondary | ICD-10-CM | POA: Diagnosis not present

## 2024-03-23 DIAGNOSIS — R946 Abnormal results of thyroid function studies: Secondary | ICD-10-CM | POA: Diagnosis not present

## 2024-03-23 MED ORDER — RSVPREF3 VAC RECOMB ADJUVANTED 120 MCG/0.5ML IM SUSR
0.5000 mL | Freq: Once | INTRAMUSCULAR | 0 refills | Status: AC
  Filled 2024-03-23: qty 1, 1d supply, fill #0

## 2024-03-26 DIAGNOSIS — N1831 Chronic kidney disease, stage 3a: Secondary | ICD-10-CM | POA: Diagnosis not present

## 2024-03-26 DIAGNOSIS — I129 Hypertensive chronic kidney disease with stage 1 through stage 4 chronic kidney disease, or unspecified chronic kidney disease: Secondary | ICD-10-CM | POA: Diagnosis not present

## 2024-03-26 DIAGNOSIS — E785 Hyperlipidemia, unspecified: Secondary | ICD-10-CM | POA: Diagnosis not present

## 2024-03-26 DIAGNOSIS — M8580 Other specified disorders of bone density and structure, unspecified site: Secondary | ICD-10-CM | POA: Diagnosis not present

## 2024-03-26 DIAGNOSIS — M109 Gout, unspecified: Secondary | ICD-10-CM | POA: Diagnosis not present

## 2024-04-02 DIAGNOSIS — N1832 Chronic kidney disease, stage 3b: Secondary | ICD-10-CM | POA: Diagnosis not present

## 2024-04-02 DIAGNOSIS — N3941 Urge incontinence: Secondary | ICD-10-CM | POA: Diagnosis not present

## 2024-04-02 DIAGNOSIS — F325 Major depressive disorder, single episode, in full remission: Secondary | ICD-10-CM | POA: Diagnosis not present

## 2024-04-02 DIAGNOSIS — R600 Localized edema: Secondary | ICD-10-CM | POA: Diagnosis not present

## 2024-04-02 DIAGNOSIS — R609 Edema, unspecified: Secondary | ICD-10-CM | POA: Diagnosis not present

## 2024-04-02 DIAGNOSIS — R82998 Other abnormal findings in urine: Secondary | ICD-10-CM | POA: Diagnosis not present

## 2024-04-02 DIAGNOSIS — M1990 Unspecified osteoarthritis, unspecified site: Secondary | ICD-10-CM | POA: Diagnosis not present

## 2024-04-02 DIAGNOSIS — Z1389 Encounter for screening for other disorder: Secondary | ICD-10-CM | POA: Diagnosis not present

## 2024-04-02 DIAGNOSIS — E669 Obesity, unspecified: Secondary | ICD-10-CM | POA: Diagnosis not present

## 2024-04-02 DIAGNOSIS — D692 Other nonthrombocytopenic purpura: Secondary | ICD-10-CM | POA: Diagnosis not present

## 2024-04-02 DIAGNOSIS — N1831 Chronic kidney disease, stage 3a: Secondary | ICD-10-CM | POA: Diagnosis not present

## 2024-04-02 DIAGNOSIS — Z0000 Encounter for general adult medical examination without abnormal findings: Secondary | ICD-10-CM | POA: Diagnosis not present

## 2024-04-02 DIAGNOSIS — E785 Hyperlipidemia, unspecified: Secondary | ICD-10-CM | POA: Diagnosis not present

## 2024-04-02 DIAGNOSIS — G4700 Insomnia, unspecified: Secondary | ICD-10-CM | POA: Diagnosis not present

## 2024-04-02 DIAGNOSIS — I1310 Hypertensive heart and chronic kidney disease without heart failure, with stage 1 through stage 4 chronic kidney disease, or unspecified chronic kidney disease: Secondary | ICD-10-CM | POA: Diagnosis not present

## 2024-04-02 DIAGNOSIS — Z1331 Encounter for screening for depression: Secondary | ICD-10-CM | POA: Diagnosis not present

## 2024-04-23 DIAGNOSIS — M793 Panniculitis, unspecified: Secondary | ICD-10-CM | POA: Diagnosis not present

## 2024-04-23 DIAGNOSIS — H61001 Unspecified perichondritis of right external ear: Secondary | ICD-10-CM | POA: Diagnosis not present

## 2024-04-23 DIAGNOSIS — L821 Other seborrheic keratosis: Secondary | ICD-10-CM | POA: Diagnosis not present

## 2024-04-23 DIAGNOSIS — L814 Other melanin hyperpigmentation: Secondary | ICD-10-CM | POA: Diagnosis not present

## 2024-04-23 DIAGNOSIS — D1801 Hemangioma of skin and subcutaneous tissue: Secondary | ICD-10-CM | POA: Diagnosis not present

## 2024-04-23 DIAGNOSIS — I872 Venous insufficiency (chronic) (peripheral): Secondary | ICD-10-CM | POA: Diagnosis not present

## 2024-04-23 DIAGNOSIS — L308 Other specified dermatitis: Secondary | ICD-10-CM | POA: Diagnosis not present

## 2024-04-23 DIAGNOSIS — Z85828 Personal history of other malignant neoplasm of skin: Secondary | ICD-10-CM | POA: Diagnosis not present

## 2024-05-01 DIAGNOSIS — H52203 Unspecified astigmatism, bilateral: Secondary | ICD-10-CM | POA: Diagnosis not present

## 2024-05-01 DIAGNOSIS — Z961 Presence of intraocular lens: Secondary | ICD-10-CM | POA: Diagnosis not present

## 2024-06-20 DIAGNOSIS — D485 Neoplasm of uncertain behavior of skin: Secondary | ICD-10-CM | POA: Diagnosis not present

## 2024-06-20 DIAGNOSIS — C44729 Squamous cell carcinoma of skin of left lower limb, including hip: Secondary | ICD-10-CM | POA: Diagnosis not present

## 2024-06-20 DIAGNOSIS — Z85828 Personal history of other malignant neoplasm of skin: Secondary | ICD-10-CM | POA: Diagnosis not present

## 2024-06-20 DIAGNOSIS — L570 Actinic keratosis: Secondary | ICD-10-CM | POA: Diagnosis not present

## 2024-07-09 DIAGNOSIS — M25562 Pain in left knee: Secondary | ICD-10-CM | POA: Diagnosis not present

## 2024-07-09 DIAGNOSIS — M25561 Pain in right knee: Secondary | ICD-10-CM | POA: Diagnosis not present

## 2024-07-09 DIAGNOSIS — R2681 Unsteadiness on feet: Secondary | ICD-10-CM | POA: Diagnosis not present

## 2024-07-11 DIAGNOSIS — M25562 Pain in left knee: Secondary | ICD-10-CM | POA: Diagnosis not present

## 2024-07-11 DIAGNOSIS — R2681 Unsteadiness on feet: Secondary | ICD-10-CM | POA: Diagnosis not present

## 2024-07-11 DIAGNOSIS — M25561 Pain in right knee: Secondary | ICD-10-CM | POA: Diagnosis not present

## 2024-07-11 DIAGNOSIS — M6281 Muscle weakness (generalized): Secondary | ICD-10-CM | POA: Diagnosis not present

## 2024-07-13 DIAGNOSIS — M6281 Muscle weakness (generalized): Secondary | ICD-10-CM | POA: Diagnosis not present

## 2024-07-13 DIAGNOSIS — M25562 Pain in left knee: Secondary | ICD-10-CM | POA: Diagnosis not present

## 2024-07-13 DIAGNOSIS — R2681 Unsteadiness on feet: Secondary | ICD-10-CM | POA: Diagnosis not present

## 2024-07-13 DIAGNOSIS — M25561 Pain in right knee: Secondary | ICD-10-CM | POA: Diagnosis not present

## 2024-07-16 DIAGNOSIS — R2681 Unsteadiness on feet: Secondary | ICD-10-CM | POA: Diagnosis not present

## 2024-07-16 DIAGNOSIS — M25562 Pain in left knee: Secondary | ICD-10-CM | POA: Diagnosis not present

## 2024-07-16 DIAGNOSIS — M6281 Muscle weakness (generalized): Secondary | ICD-10-CM | POA: Diagnosis not present

## 2024-07-16 DIAGNOSIS — M25561 Pain in right knee: Secondary | ICD-10-CM | POA: Diagnosis not present

## 2024-07-19 DIAGNOSIS — M109 Gout, unspecified: Secondary | ICD-10-CM | POA: Diagnosis not present

## 2024-07-19 DIAGNOSIS — M79644 Pain in right finger(s): Secondary | ICD-10-CM | POA: Diagnosis not present

## 2024-07-19 DIAGNOSIS — M7989 Other specified soft tissue disorders: Secondary | ICD-10-CM | POA: Diagnosis not present

## 2024-07-19 DIAGNOSIS — E790 Hyperuricemia without signs of inflammatory arthritis and tophaceous disease: Secondary | ICD-10-CM | POA: Diagnosis not present

## 2024-07-23 DIAGNOSIS — C44729 Squamous cell carcinoma of skin of left lower limb, including hip: Secondary | ICD-10-CM | POA: Diagnosis not present

## 2024-07-23 DIAGNOSIS — Z85828 Personal history of other malignant neoplasm of skin: Secondary | ICD-10-CM | POA: Diagnosis not present

## 2024-07-31 ENCOUNTER — Other Ambulatory Visit: Payer: Self-pay | Admitting: Internal Medicine

## 2024-07-31 DIAGNOSIS — Z1231 Encounter for screening mammogram for malignant neoplasm of breast: Secondary | ICD-10-CM

## 2024-09-05 ENCOUNTER — Ambulatory Visit
Admission: RE | Admit: 2024-09-05 | Discharge: 2024-09-05 | Disposition: A | Discharge status: Home / Self Care | Source: Ambulatory Visit | Attending: Internal Medicine | Admitting: Internal Medicine

## 2024-09-05 DIAGNOSIS — Z1231 Encounter for screening mammogram for malignant neoplasm of breast: Secondary | ICD-10-CM

## 2024-10-11 DIAGNOSIS — F325 Major depressive disorder, single episode, in full remission: Secondary | ICD-10-CM | POA: Diagnosis not present

## 2024-10-11 DIAGNOSIS — M1990 Unspecified osteoarthritis, unspecified site: Secondary | ICD-10-CM | POA: Diagnosis not present

## 2024-10-11 DIAGNOSIS — R600 Localized edema: Secondary | ICD-10-CM | POA: Diagnosis not present

## 2024-10-11 DIAGNOSIS — E669 Obesity, unspecified: Secondary | ICD-10-CM | POA: Diagnosis not present

## 2024-10-11 DIAGNOSIS — I1310 Hypertensive heart and chronic kidney disease without heart failure, with stage 1 through stage 4 chronic kidney disease, or unspecified chronic kidney disease: Secondary | ICD-10-CM | POA: Diagnosis not present

## 2024-10-11 DIAGNOSIS — N1832 Chronic kidney disease, stage 3b: Secondary | ICD-10-CM | POA: Diagnosis not present

## 2024-10-11 DIAGNOSIS — E785 Hyperlipidemia, unspecified: Secondary | ICD-10-CM | POA: Diagnosis not present

## 2024-10-11 DIAGNOSIS — N3941 Urge incontinence: Secondary | ICD-10-CM | POA: Diagnosis not present
